# Patient Record
Sex: Female | Born: 1940 | Race: White | Hispanic: No | Marital: Married | Smoking: Never smoker
Health system: Southern US, Community
[De-identification: ages and names within clinical notes are randomized; demographics above are authoritative.]

## PROBLEM LIST (undated history)

## (undated) DIAGNOSIS — M1990 Unspecified osteoarthritis, unspecified site: Secondary | ICD-10-CM

## (undated) DIAGNOSIS — H409 Unspecified glaucoma: Secondary | ICD-10-CM

## (undated) HISTORY — PX: BACK SURGERY: SHX140

## (undated) HISTORY — PX: EYE SURGERY: SHX253

---

## 1975-09-20 HISTORY — PX: WISDOM TOOTH EXTRACTION: SHX21

## 1999-04-12 ENCOUNTER — Other Ambulatory Visit: Admission: RE | Admit: 1999-04-12 | Discharge: 1999-04-12 | Payer: Self-pay | Admitting: Obstetrics and Gynecology

## 1999-12-15 ENCOUNTER — Other Ambulatory Visit: Admission: RE | Admit: 1999-12-15 | Discharge: 1999-12-15 | Payer: Self-pay | Admitting: Obstetrics and Gynecology

## 1999-12-15 ENCOUNTER — Encounter (INDEPENDENT_AMBULATORY_CARE_PROVIDER_SITE_OTHER): Payer: Self-pay

## 2000-04-18 ENCOUNTER — Other Ambulatory Visit: Admission: RE | Admit: 2000-04-18 | Discharge: 2000-04-18 | Payer: Self-pay | Admitting: Obstetrics and Gynecology

## 2001-04-30 ENCOUNTER — Other Ambulatory Visit: Admission: RE | Admit: 2001-04-30 | Discharge: 2001-04-30 | Payer: Self-pay | Admitting: Obstetrics and Gynecology

## 2004-06-22 ENCOUNTER — Other Ambulatory Visit: Admission: RE | Admit: 2004-06-22 | Discharge: 2004-06-22 | Payer: Self-pay | Admitting: Obstetrics and Gynecology

## 2004-07-26 ENCOUNTER — Encounter (INDEPENDENT_AMBULATORY_CARE_PROVIDER_SITE_OTHER): Payer: Self-pay | Admitting: Specialist

## 2004-07-26 ENCOUNTER — Ambulatory Visit (HOSPITAL_COMMUNITY): Admission: RE | Admit: 2004-07-26 | Discharge: 2004-07-26 | Payer: Self-pay | Admitting: Gastroenterology

## 2005-08-04 ENCOUNTER — Other Ambulatory Visit: Admission: RE | Admit: 2005-08-04 | Discharge: 2005-08-04 | Payer: Self-pay | Admitting: Obstetrics and Gynecology

## 2006-09-29 ENCOUNTER — Other Ambulatory Visit: Admission: RE | Admit: 2006-09-29 | Discharge: 2006-09-29 | Payer: Self-pay | Admitting: Obstetrics & Gynecology

## 2008-01-18 ENCOUNTER — Other Ambulatory Visit: Admission: RE | Admit: 2008-01-18 | Discharge: 2008-01-18 | Payer: Self-pay | Admitting: Obstetrics and Gynecology

## 2009-01-29 ENCOUNTER — Other Ambulatory Visit: Admission: RE | Admit: 2009-01-29 | Discharge: 2009-01-29 | Payer: Self-pay | Admitting: Obstetrics and Gynecology

## 2011-02-04 NOTE — Op Note (Signed)
NAMEDEMANI, WEYRAUCH            ACCOUNT NO.:  [id number]   MEDICAL RECORD NO.:  [id number]          PATIENT TYPE:  AMB   LOCATION:  ENDO                         FACILITY:  [facility]   PHYSICIAN:  Graylin Shiver, M.D.   DATE OF BIRTH:  10-Oct-1940   DATE OF PROCEDURE:  07/26/2004  DATE OF DISCHARGE:                                 OPERATIVE REPORT   PROCEDURE:  Colonoscopy with biopsy.   INDICATIONS:  Rectal bleeding.   Informed consent was obtained after explanation of the risks of bleeding,  infection, and perforation.   PREMEDICATIONS:  Fentanyl 75 mcg IV, Versed 6 mg IV.   PROCEDURE:  With the patient in the left lateral decubitus position, a  rectal exam was performed.  No masses were felt.  The Olympus colonoscope  was inserted into the rectum and advanced around the colon to the cecum.  The colon was very tortuous.  The cecum looked normal.  The ileocecal valve  was intubated, and the first few centimeters of the terminal ileum looked  normal.  The ascending colon looked normal.  In the proximal transverse  colon, there were two small 2-3 mm polyps biopsied off with cold forceps.  The rest of the transverse colon looked normal.  The ascending colon,  rectum, and sigmoid looked normal.  There were some internal hemorrhoids.  She tolerated the procedure well without complications.   IMPRESSION:  1.  Two colon polyps at the proximal transverse colon.  2.  Internal hemorrhoids.   PLAN:  Pathology will be checked.   IMPRESSION:  Normal colonoscopy to the cecum.   PLAN:  I would recommend a follow-up screening colonoscopy again in 10  years.      SFG/MEDQ  D:  07/26/2004  T:  07/26/2004  Job:  [id number]   cc:   Pam Drown, M.D.  [address]  [place]  [place] [address]  Fax: [phone]

## 2013-05-15 ENCOUNTER — Other Ambulatory Visit (HOSPITAL_COMMUNITY)
Admission: RE | Admit: 2013-05-15 | Discharge: 2013-05-15 | Disposition: A | Discharge status: Home / Self Care | Payer: PRIVATE HEALTH INSURANCE | Source: Ambulatory Visit | Attending: Family Medicine | Admitting: Family Medicine

## 2013-05-15 ENCOUNTER — Other Ambulatory Visit: Payer: Self-pay | Admitting: Family Medicine

## 2013-05-15 DIAGNOSIS — Z1151 Encounter for screening for human papillomavirus (HPV): Secondary | ICD-10-CM | POA: Insufficient documentation

## 2013-05-15 DIAGNOSIS — Z0000 Encounter for general adult medical examination without abnormal findings: Secondary | ICD-10-CM | POA: Insufficient documentation

## 2016-08-17 ENCOUNTER — Other Ambulatory Visit: Payer: Self-pay | Admitting: Family Medicine

## 2016-08-17 ENCOUNTER — Ambulatory Visit
Admission: RE | Admit: 2016-08-17 | Discharge: 2016-08-17 | Disposition: A | Discharge status: Home / Self Care | Payer: Medicare Other | Source: Ambulatory Visit | Attending: Family Medicine | Admitting: Family Medicine

## 2016-08-17 DIAGNOSIS — G8929 Other chronic pain: Secondary | ICD-10-CM

## 2016-08-17 DIAGNOSIS — M545 Low back pain: Principal | ICD-10-CM

## 2017-05-17 IMAGING — CR DG LUMBAR SPINE 2-3V
3 series · 3 of 3 positions shown · non-contrast
Comparison: None.

CLINICAL DATA: Low back pain, worse on the left side and radiates
down both legs at times.

EXAM:
LUMBAR SPINE - 2-3 VIEW

[t l-spine a.p.]
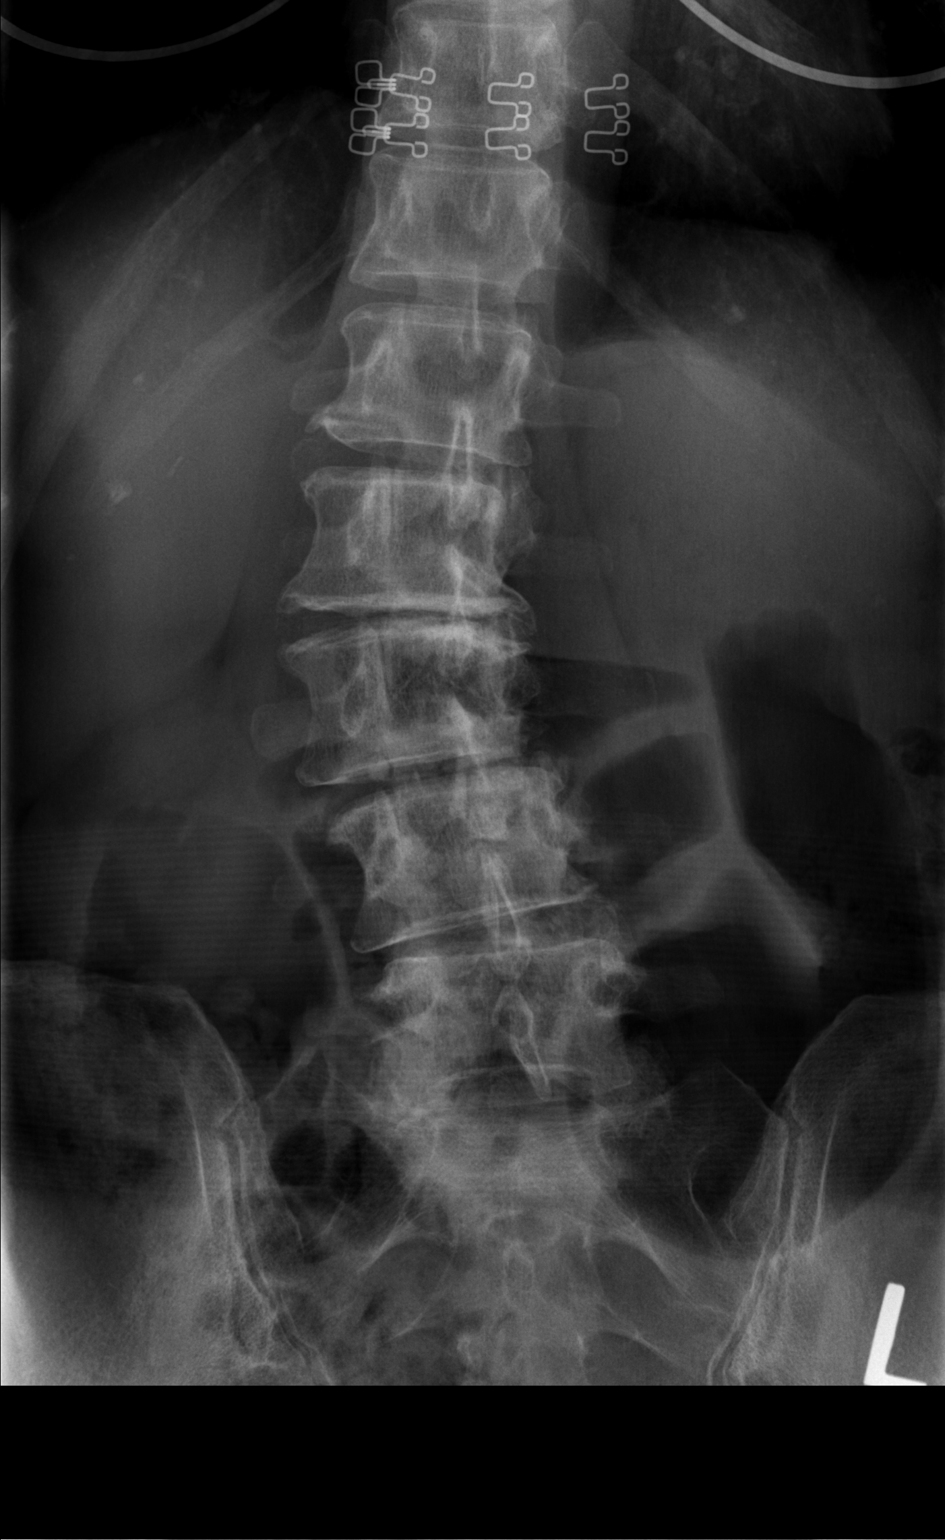

[t l-spine lat]
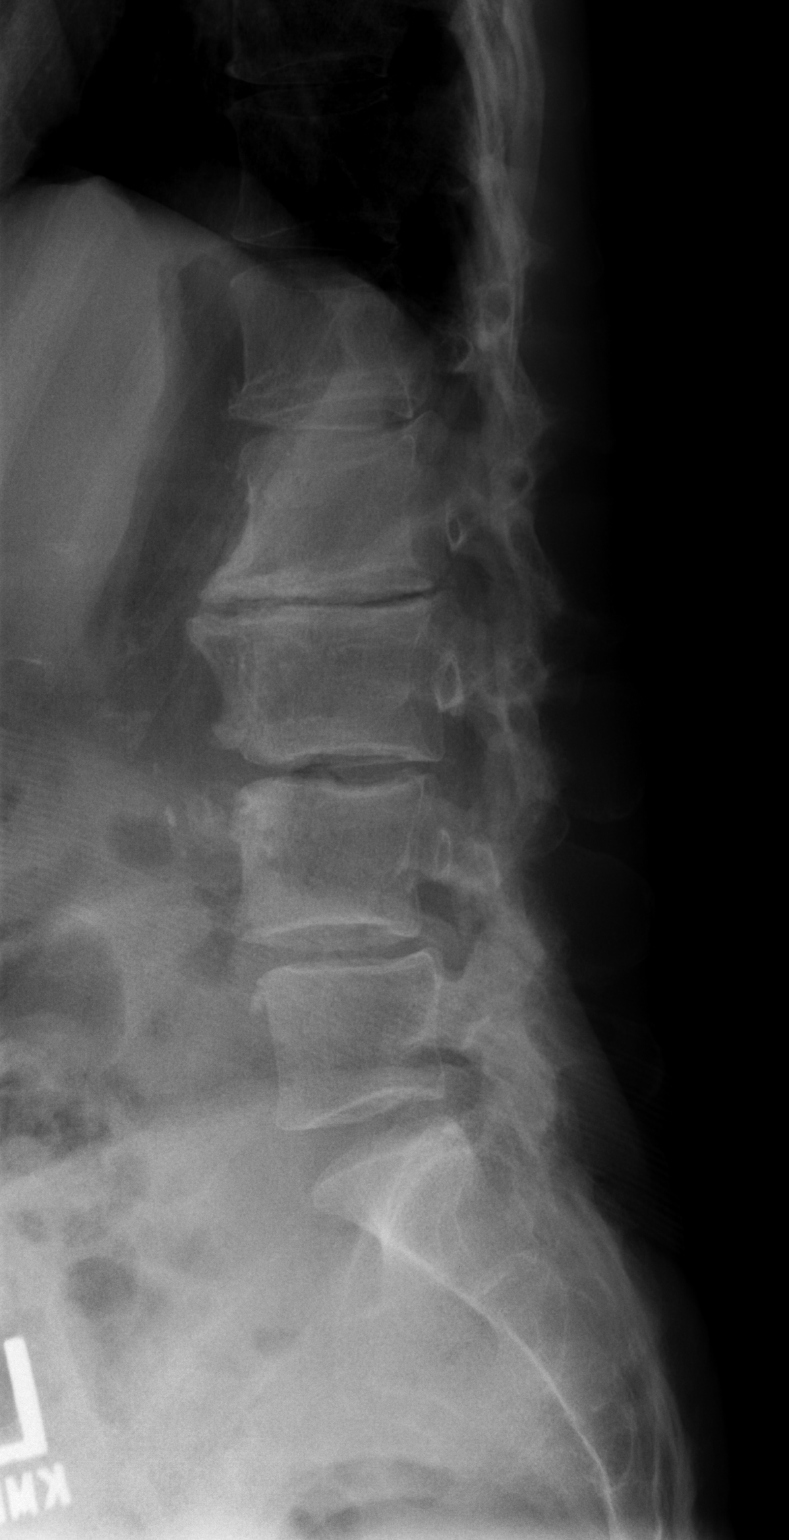

[t l-spine l5-s1 spot]
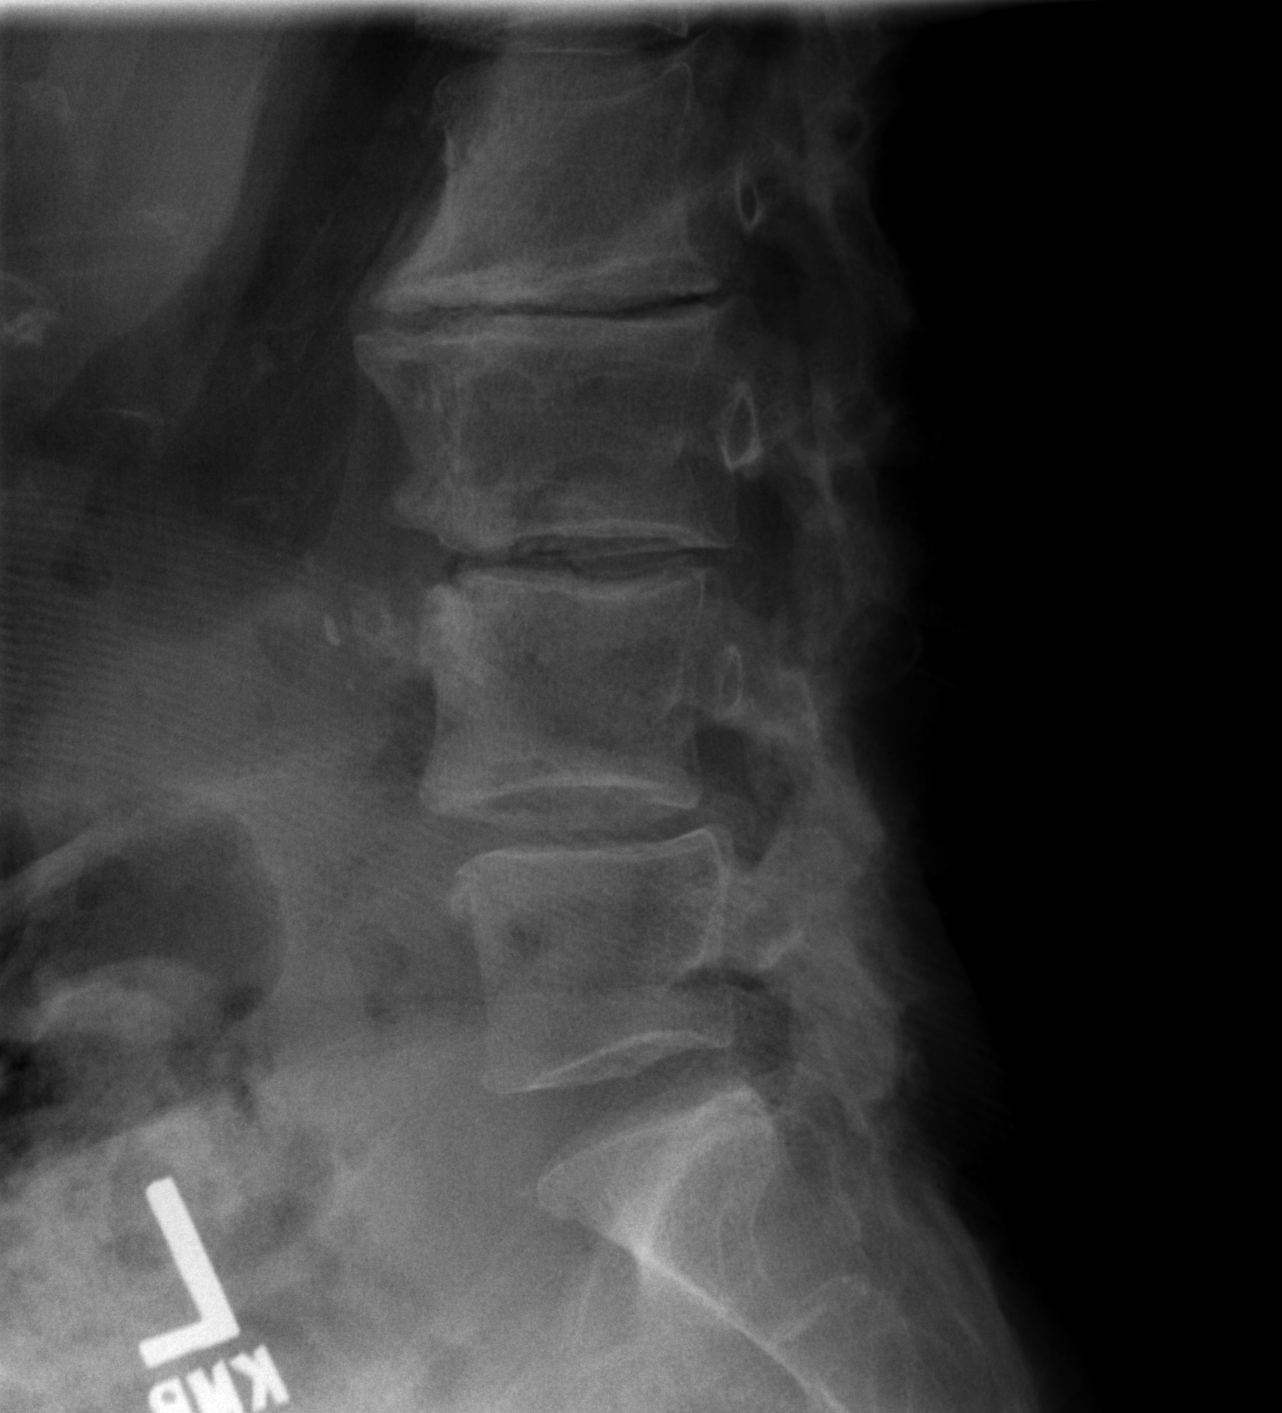

[3 of 3 positions shown; findings below may reference images not displayed]

FINDINGS: Dextroscoliosis of the lumbar spine with the apex at L2-L3. There is
marked disc space narrowing along the left side of L2-L3. Narrowing
along the lateral aspect of the right disc space at L4-L5. Mild disc
space narrowing and endplate changes at L3-L4. Vertebral body
heights are maintained. Minimal anterolisthesis at L4-L5 and minimal
retrolisthesis at L3-L4. No evidence for a fracture.
IMPRESSION: Scoliosis with disc disease, particularly at L2-L3.

## 2019-10-08 ENCOUNTER — Ambulatory Visit: Payer: Medicare Other | Attending: Internal Medicine

## 2019-10-08 DIAGNOSIS — Z23 Encounter for immunization: Secondary | ICD-10-CM

## 2019-10-08 NOTE — Progress Notes (Signed)
   Covid-19 Vaccination Clinic  Name:  Taylor Barker    MRN: [id number] DOB: 1941-04-04  10/08/2019  [name] was observed post Covid-19 immunization for 15 minutes without incidence. She was provided with Vaccine Information Sheet and instruction to access the V-Safe system.   [name] was instructed to call 911 with any severe reactions post vaccine: Marland Kitchen Difficulty breathing  . Swelling of your face and throat  . A fast heartbeat  . A bad rash all over your body  . Dizziness and weakness    Immunizations Administered    Name Date Dose VIS Date Route   Pfizer COVID-19 Vaccine 10/08/2019 11:32 AM 0.3 mL 08/30/2019 Intramuscular   Manufacturer: [employer], [employer]   Lot: [id number]   NDC: [address]-2

## 2019-10-28 ENCOUNTER — Ambulatory Visit: Payer: Medicare Other | Attending: Internal Medicine

## 2019-10-28 DIAGNOSIS — Z23 Encounter for immunization: Secondary | ICD-10-CM | POA: Insufficient documentation

## 2019-10-28 NOTE — Progress Notes (Signed)
   Covid-19 Vaccination Clinic  Name:  Taylor Barker    MRN: [id number] DOB: 07/10/1941  10/28/2019  [name] was observed post Covid-19 immunization for 15 minutes without incidence. She was provided with Vaccine Information Sheet and instruction to access the V-Safe system.   [name] was instructed to call 911 with any severe reactions post vaccine: Marland Kitchen Difficulty breathing  . Swelling of your face and throat  . A fast heartbeat  . A bad rash all over your body  . Dizziness and weakness    Immunizations Administered    Name Date Dose VIS Date Route   Pfizer COVID-19 Vaccine 10/28/2019  9:45 AM 0.3 mL 08/30/2019 Intramuscular   Manufacturer: [employer], [employer]   Lot: [id number]   NDC: [address]-7

## 2020-12-27 ENCOUNTER — Encounter (HOSPITAL_BASED_OUTPATIENT_CLINIC_OR_DEPARTMENT_OTHER): Payer: Self-pay

## 2020-12-27 ENCOUNTER — Emergency Department (HOSPITAL_BASED_OUTPATIENT_CLINIC_OR_DEPARTMENT_OTHER): Payer: Medicare Other

## 2020-12-27 ENCOUNTER — Emergency Department (HOSPITAL_BASED_OUTPATIENT_CLINIC_OR_DEPARTMENT_OTHER)
Admission: EM | Admit: 2020-12-27 | Discharge: 2020-12-27 | Disposition: A | Discharge status: Home / Self Care | Payer: Medicare Other | Attending: Emergency Medicine | Admitting: Emergency Medicine

## 2020-12-27 ENCOUNTER — Other Ambulatory Visit: Payer: Self-pay

## 2020-12-27 DIAGNOSIS — S60222A Contusion of left hand, initial encounter: Secondary | ICD-10-CM

## 2020-12-27 DIAGNOSIS — M7989 Other specified soft tissue disorders: Secondary | ICD-10-CM | POA: Diagnosis not present

## 2020-12-27 DIAGNOSIS — M25532 Pain in left wrist: Secondary | ICD-10-CM | POA: Diagnosis not present

## 2020-12-27 DIAGNOSIS — W1839XA Other fall on same level, initial encounter: Secondary | ICD-10-CM | POA: Insufficient documentation

## 2020-12-27 DIAGNOSIS — S6992XA Unspecified injury of left wrist, hand and finger(s), initial encounter: Secondary | ICD-10-CM | POA: Diagnosis present

## 2020-12-27 HISTORY — DX: Unspecified glaucoma: H40.9

## 2020-12-27 NOTE — ED Triage Notes (Signed)
Pt to ED from home with c/o fall from seated position getting out of car. Pt denies hitting her head or LOC. Pt primary concern is her ring finger is swollen and she is unable to remove her wedding ring at this time.

## 2020-12-27 NOTE — ED Provider Notes (Signed)
[facility] EMERGENCY DEPT Provider Note   CSN: [id number] Arrival date & time: 12/27/20  2054     History Chief Complaint  Patient presents with  . Fall    Taylor Barker is a 80 y.o. female.  HPI   Patient presented to the ED for evaluation of hand pain.  Patient states she fell while she was getting out of her car.  She ended up falling onto her left hand.  Patient started having some swelling on the left ring finger.  She was unable to get her rings off.  She also had some mild pain in her left wrist.  She did not hit her head.  She does not have any numbness or weakness.  No other injuries  Past Medical History:  Diagnosis Date  . Glaucoma     There are no problems to display for this patient.   Past Surgical History:  Procedure Laterality Date  . BACK SURGERY       OB History   No obstetric history on file.     History reviewed. No pertinent family history.  Social History   Tobacco Use  . Smoking status: Never Smoker  . Smokeless tobacco: Never Used  Substance Use Topics  . Alcohol use: Not Currently  . Drug use: Not Currently    Home Medications Prior to Admission medications   Not on File    Allergies    Bee pollen  Review of Systems   Review of Systems  All other systems reviewed and are negative.   Physical Exam Updated Vital Signs BP (!) 172/81 (BP Location: Right Arm)   Pulse 86   Temp 98.3 F (36.8 C) (Oral)   Ht 1.575 m (5\' 2" )   Wt 44.5 kg   SpO2 100%   BMI 17.92 kg/m   Physical Exam Vitals and nursing note reviewed.  Constitutional:      General: She is not in acute distress.    Appearance: She is well-developed.  HENT:     Head: Normocephalic and atraumatic.     Right Ear: External ear normal.     Left Ear: External ear normal.  Eyes:     General: No scleral icterus.       Right eye: No discharge.        Left eye: No discharge.     Conjunctiva/sclera: Conjunctivae normal.  Neck:     Trachea:  No tracheal deviation.  Cardiovascular:     Rate and Rhythm: Normal rate.  Pulmonary:     Effort: Pulmonary effort is normal. No respiratory distress.     Breath sounds: No stridor.  Abdominal:     General: There is no distension.  Musculoskeletal:        General: Tenderness present. No swelling or deformity.     Cervical back: Neck supple.     Comments: Mild tenderness palpation left ring finger, swelling noted around the ring, mild tenderness palpation left wrist  Skin:    General: Skin is warm and dry.     Findings: No rash.  Neurological:     Mental Status: She is alert.     Cranial Nerves: Cranial nerve deficit: no gross deficits.     ED Results / Procedures / Treatments   Labs (all labs ordered are listed, but only abnormal results are displayed) Labs Reviewed - No data to display  EKG None  Radiology DG Wrist Complete Left  Result Date: 12/27/2020 CLINICAL DATA:  80 year female with  fall and left wrist pain. EXAM: LEFT HAND - COMPLETE 3+ VIEW; LEFT WRIST - COMPLETE 3+ VIEW COMPARISON:  None. FINDINGS: Faint curvilinear densities along the posterior aspect of the wrist likely chronic and related to degenerative changes. Acute cortical fractures are less likely. No definite acute fracture or dislocation. The bones are osteopenic. There is arthritic changes of the base of the thumb. The soft tissues are unremarkable. IMPRESSION: 1. No definite acute fracture or dislocation. 2. 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 Electronically Signed   By: Elgie Collard M.D.   On: 12/27/2020 21:57   DG Hand Complete Left  Result Date: 12/27/2020 CLINICAL DATA:  80 year female with fall and left wrist pain. EXAM: LEFT HAND - COMPLETE 3+ VIEW; LEFT WRIST - COMPLETE 3+ VIEW COMPARISON:  None. FINDINGS: Faint curvilinear densities along the posterior aspect of the wrist likely chronic and related to degenerative changes. Acute cortical fractures are  less likely. No definite acute fracture or dislocation. The bones are osteopenic. There is arthritic changes of the base of the thumb. The soft tissues are unremarkable. IMPRESSION: 1. No definite acute fracture or dislocation. 2. 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 Electronically Signed   By: Elgie Collard M.D.   On: 12/27/2020 21:57    Procedures Procedures   Medications Ordered in ED Medications - No data to display  ED Course  I have reviewed the triage vital signs and the nursing notes.  Pertinent labs & imaging results that were available during my care of the patient were reviewed by me and considered in my medical decision making (see chart for details).    MDM Rules/Calculators/A&P                          X-rays without signs of fracture.  Patient's rings had to be removed by the ED staff.  Stable for discharge.  Recommend ice, over-the-counter pain medications as needed Final Clinical Impression(s) / ED Diagnoses Final diagnoses:  Contusion of left hand, initial encounter    Rx / DC Orders ED Discharge Orders    None       Linwood Dibbles, MD 12/27/20 2213

## 2020-12-27 NOTE — Discharge Instructions (Addendum)
Take over-the-counter medications as needed for pain.  Apply ice to help with the swelling.

## 2021-06-25 ENCOUNTER — Other Ambulatory Visit: Payer: Self-pay | Admitting: Neurological Surgery

## 2021-08-11 NOTE — Pre-Procedure Instructions (Signed)
Surgical Instructions    Your procedure is scheduled on Wednesday, August 18, 2021 at 7:30 AM.  Report to [facility] Main Entrance "A" at 5:30 A.M., then check in with the Admitting office.  Call this number if you have problems the morning of surgery:  [phone]   If you have any questions prior to your surgery date call [phone]: Open Monday-Friday 8am-4pm    Remember:  Do not eat or drink after midnight the night before your surgery    Take these medicines the morning of surgery with A SIP OF WATER:  acetaminophen (TYLENOL) - if needed  As of today, STOP taking any Aspirin (unless otherwise instructed by your surgeon) Aleve, Naproxen, Ibuprofen, Motrin, Advil, Goody's, BC's, all herbal medications, fish oil, and all vitamins.                     Do NOT Smoke (Tobacco/Vaping) or drink Alcohol 24 hours prior to your procedure.  If you use a CPAP at night, you may bring all equipment for your overnight stay.   Contacts, glasses, piercing's, hearing aid's, dentures or partials may not be worn into surgery, please bring cases for these belongings.    For patients admitted to the hospital, discharge time will be determined by your treatment team.   Patients discharged the day of surgery will not be allowed to drive home, and someone needs to stay with them for 24 hours.  NO VISITORS WILL BE ALLOWED IN PRE-OP WHERE PATIENTS GET READY FOR SURGERY.  ONLY 1 SUPPORT PERSON MAY BE PRESENT IN THE WAITING ROOM WHILE YOU ARE IN SURGERY.  IF YOU ARE TO BE ADMITTED, ONCE YOU ARE IN YOUR ROOM YOU WILL BE ALLOWED TWO (2) VISITORS.  Minor children may have two parents present. Special consideration for safety and communication needs will be reviewed on a case by case basis.   Special instructions:   [place]- Preparing For Surgery  Before surgery, you can play an important role. Because skin is not sterile, your skin needs to be as free of germs as possible. You can reduce the number  of germs on your skin by washing with CHG (chlorahexidine gluconate) Soap before surgery.  CHG is an antiseptic cleaner which kills germs and bonds with the skin to continue killing germs even after washing.    Oral Hygiene is also important to reduce your risk of infection.  Remember - BRUSH YOUR TEETH THE MORNING OF SURGERY WITH YOUR REGULAR TOOTHPASTE  Please do not use if you have an allergy to CHG or antibacterial soaps. If your skin becomes reddened/irritated stop using the CHG.  Do not shave (including legs and underarms) for at least 48 hours prior to first CHG shower. It is OK to shave your face.  Please follow these instructions carefully.   Shower the NIGHT BEFORE SURGERY and the MORNING OF SURGERY  If you chose to wash your hair, wash your hair first as usual with your normal shampoo.  After you shampoo, rinse your hair and body thoroughly to remove the shampoo.  Use CHG Soap as you would any other liquid soap. You can apply CHG directly to the skin and wash gently with a scrungie or a clean washcloth.   Apply the CHG Soap to your body ONLY FROM THE NECK DOWN.  Do not use on open wounds or open sores. Avoid contact with your eyes, ears, mouth and genitals (private parts). Wash Face and genitals (private parts)  with your  normal soap.   Wash thoroughly, paying special attention to the area where your surgery will be performed.  Thoroughly rinse your body with warm water from the neck down.  DO NOT shower/wash with your normal soap after using and rinsing off the CHG Soap.  Pat yourself dry with a CLEAN TOWEL.  Wear CLEAN PAJAMAS to bed the night before surgery  Place CLEAN SHEETS on your bed the night before your surgery  DO NOT SLEEP WITH PETS.   Day of Surgery: Shower with CHG soap. Do not wear jewelry, make up, nail polish, gel polish, artificial nails, or any other type of covering on natural nails including finger and toenails. If patients have artificial nails,  gel coating, etc. that need to be removed by a nail salon please have this removed prior to surgery. Surgery may need to be canceled/delayed if the surgeon/ anesthesia feels like the patient is unable to be adequately monitored. Do not wear lotions, powders, perfumes, or deodorant. Do not shave 48 hours prior to surgery. Do not bring valuables to the hospital. [facility] is not responsible for any belongings or valuables. Wear Clean/Comfortable clothing the morning of surgery Remember to brush your teeth WITH YOUR REGULAR TOOTHPASTE.   Please read over the following fact sheets that you were given.   3 days prior to your procedure or After your COVID test   You are not required to quarantine however you are required to wear a well-fitting mask when you are out and around people not in your household. If your mask becomes wet or soiled, replace with a new one.   Wash your hands often with soap and water for 20 seconds or clean your hands with an alcohol-based hand sanitizer that contains at least 60% alcohol.   Do not share personal items.   Notify your provider:  o if you are in close contact with someone who has COVID  o or if you develop a fever of 100.4 or greater, sneezing, cough, sore throat, shortness of breath or body aches.

## 2021-08-16 ENCOUNTER — Encounter (HOSPITAL_COMMUNITY): Payer: Self-pay

## 2021-08-16 ENCOUNTER — Other Ambulatory Visit: Payer: Self-pay

## 2021-08-16 ENCOUNTER — Encounter (HOSPITAL_COMMUNITY)
Admission: RE | Admit: 2021-08-16 | Discharge: 2021-08-16 | Disposition: A | Discharge status: Home / Self Care | Payer: Medicare Other | Source: Ambulatory Visit | Attending: Neurological Surgery | Admitting: Neurological Surgery

## 2021-08-16 VITALS — BP 154/72 | HR 91 | Temp 98.2°F | Resp 19 | Ht 62.0 in | Wt 94.1 lb

## 2021-08-16 DIAGNOSIS — Z01818 Encounter for other preprocedural examination: Secondary | ICD-10-CM

## 2021-08-16 DIAGNOSIS — Z01812 Encounter for preprocedural laboratory examination: Secondary | ICD-10-CM | POA: Diagnosis not present

## 2021-08-16 DIAGNOSIS — Z20822 Contact with and (suspected) exposure to covid-19: Secondary | ICD-10-CM | POA: Diagnosis not present

## 2021-08-16 LAB — SURGICAL PCR SCREEN
MRSA, PCR: NEGATIVE
Staphylococcus aureus: NEGATIVE

## 2021-08-16 LAB — CBC
HCT: 39.6 % (ref 36.0–46.0)
Hemoglobin: 13.2 g/dL (ref 12.0–15.0)
MCH: 32 pg (ref 26.0–34.0)
MCHC: 33.3 g/dL (ref 30.0–36.0)
MCV: 95.9 fL (ref 80.0–100.0)
Platelets: 316 10*3/uL (ref 150–400)
RBC: 4.13 MIL/uL (ref 3.87–5.11)
RDW: 11.7 % (ref 11.5–15.5)
WBC: 6 10*3/uL (ref 4.0–10.5)
nRBC: 0 % (ref 0.0–0.2)

## 2021-08-16 LAB — PROTIME-INR
INR: 1.1 (ref 0.8–1.2)
Prothrombin Time: 13.7 seconds (ref 11.4–15.2)

## 2021-08-16 LAB — SARS CORONAVIRUS 2 (TAT 6-24 HRS): SARS Coronavirus 2: NEGATIVE

## 2021-08-16 NOTE — Progress Notes (Signed)
PCP - Dr. Selena Batten Cardiologist - denies  PPM/ICD - n/a  Chest x-ray - n/a EKG - n/a Stress Test - denies  ECHO - denies Cardiac Cath - denies  Sleep Study - denies CPAP - denies  NPO at MD.  Blood Thinner Instructions: n/a Aspirin Instructions: n/a  COVID TEST- 08/16/21, done in PAT   Anesthesia review: No  Patient denies shortness of breath, fever, cough and chest pain at PAT appointment   All instructions explained to the patient, with a verbal understanding of the material. Patient agrees to go over the instructions while at home for a better understanding. Patient also instructed to self quarantine after being tested for COVID-19. The opportunity to ask questions was provided.

## 2021-08-17 NOTE — Anesthesia Preprocedure Evaluation (Addendum)
Anesthesia Evaluation  Patient identified by MRN, date of birth, ID band Patient awake    Reviewed: Allergy & Precautions, NPO status , Patient's Chart, lab work & pertinent test results  Airway Mallampati: II  TM Distance: >3 FB Neck ROM: Full    Dental no notable dental hx. (+) Dental Advisory Given, Teeth Intact   Pulmonary neg pulmonary ROS,    Pulmonary exam normal breath sounds clear to auscultation       Cardiovascular negative cardio ROS Normal cardiovascular exam Rhythm:Regular Rate:Normal     Neuro/Psych negative neurological ROS  negative psych ROS   GI/Hepatic negative GI ROS, Neg liver ROS,   Endo/Other  negative endocrine ROS  Renal/GU negative Renal ROS     Musculoskeletal negative musculoskeletal ROS (+)   Abdominal   Peds  Hematology negative hematology ROS (+)   Anesthesia Other Findings   Reproductive/Obstetrics                            Anesthesia Physical Anesthesia Plan  ASA: 2  Anesthesia Plan: General   Post-op Pain Management: Tylenol PO (pre-op)   Induction: Intravenous  PONV Risk Score and Plan: 4 or greater and Ondansetron, Dexamethasone, Treatment may vary due to age or medical condition and Diphenhydramine  Airway Management Planned: Oral ETT  Additional Equipment:   Intra-op Plan:   Post-operative Plan: Extubation in OR  Informed Consent: I have reviewed the patients History and Physical, chart, labs and discussed the procedure including the risks, benefits and alternatives for the proposed anesthesia with the patient or authorized representative who has indicated his/her understanding and acceptance.     Dental advisory given  Plan Discussed with: CRNA  Anesthesia Plan Comments:        Anesthesia Quick Evaluation

## 2021-08-18 ENCOUNTER — Ambulatory Visit (HOSPITAL_COMMUNITY): Payer: Medicare Other | Admitting: Anesthesiology

## 2021-08-18 ENCOUNTER — Encounter (HOSPITAL_COMMUNITY): Payer: Self-pay | Admitting: Neurological Surgery

## 2021-08-18 ENCOUNTER — Encounter (HOSPITAL_COMMUNITY)
Admission: RE | Disposition: A | Discharge status: Home / Self Care | Payer: Self-pay | Source: Home / Self Care | Attending: Neurological Surgery

## 2021-08-18 ENCOUNTER — Ambulatory Visit (HOSPITAL_COMMUNITY): Payer: Medicare Other

## 2021-08-18 ENCOUNTER — Other Ambulatory Visit: Payer: Self-pay

## 2021-08-18 ENCOUNTER — Ambulatory Visit (HOSPITAL_COMMUNITY)
Admission: RE | Admit: 2021-08-18 | Discharge: 2021-08-18 | Disposition: A | Discharge status: Home / Self Care | Payer: Medicare Other | Attending: Neurological Surgery | Admitting: Neurological Surgery

## 2021-08-18 DIAGNOSIS — M5416 Radiculopathy, lumbar region: Secondary | ICD-10-CM | POA: Diagnosis not present

## 2021-08-18 DIAGNOSIS — M48061 Spinal stenosis, lumbar region without neurogenic claudication: Secondary | ICD-10-CM | POA: Diagnosis not present

## 2021-08-18 DIAGNOSIS — Z419 Encounter for procedure for purposes other than remedying health state, unspecified: Secondary | ICD-10-CM

## 2021-08-18 HISTORY — PX: LUMBAR LAMINECTOMY/DECOMPRESSION MICRODISCECTOMY: SHX5026

## 2021-08-18 SURGERY — LUMBAR LAMINECTOMY/DECOMPRESSION MICRODISCECTOMY 2 LEVELS
Anesthesia: General | Site: Back | Laterality: Right

## 2021-08-18 MED ORDER — DEXAMETHASONE SODIUM PHOSPHATE 10 MG/ML IJ SOLN
INTRAMUSCULAR | Status: DC | PRN
  Administered 2021-08-18: 10 mg via INTRAVENOUS

## 2021-08-18 MED ORDER — BUPIVACAINE HCL (PF) 0.25 % IJ SOLN
INTRAMUSCULAR | Status: AC
  Filled 2021-08-18: qty 30

## 2021-08-18 MED ORDER — PROPOFOL 10 MG/ML IV BOLUS
INTRAVENOUS | Status: AC
  Filled 2021-08-18: qty 20

## 2021-08-18 MED ORDER — SUGAMMADEX SODIUM 200 MG/2ML IV SOLN
INTRAVENOUS | Status: DC | PRN
  Administered 2021-08-18: 200 mg via INTRAVENOUS

## 2021-08-18 MED ORDER — CEFAZOLIN SODIUM-DEXTROSE 2-4 GM/100ML-% IV SOLN
INTRAVENOUS | Status: AC
  Filled 2021-08-18: qty 100

## 2021-08-18 MED ORDER — FENTANYL CITRATE (PF) 250 MCG/5ML IJ SOLN
INTRAMUSCULAR | Status: AC
  Filled 2021-08-18: qty 5

## 2021-08-18 MED ORDER — PHENYLEPHRINE HCL (PRESSORS) 10 MG/ML IV SOLN
INTRAVENOUS | Status: DC | PRN
  Administered 2021-08-18: 80 ug via INTRAVENOUS
  Administered 2021-08-18: 40 ug via INTRAVENOUS
  Administered 2021-08-18: 80 ug via INTRAVENOUS

## 2021-08-18 MED ORDER — CEFAZOLIN SODIUM-DEXTROSE 2-4 GM/100ML-% IV SOLN: 2.0000 g | INTRAVENOUS | Status: DC

## 2021-08-18 MED ORDER — THROMBIN 5000 UNITS EX SOLR
OROMUCOSAL | Status: DC | PRN
  Administered 2021-08-18: 5 mL via TOPICAL

## 2021-08-18 MED ORDER — ACETAMINOPHEN 500 MG PO TABS: 1000.0000 mg | ORAL_TABLET | Freq: Once | ORAL | Status: DC

## 2021-08-18 MED ORDER — FENTANYL CITRATE (PF) 250 MCG/5ML IJ SOLN
INTRAMUSCULAR | Status: DC | PRN
  Administered 2021-08-18: 50 ug via INTRAVENOUS

## 2021-08-18 MED ORDER — PROPOFOL 10 MG/ML IV BOLUS
INTRAVENOUS | Status: DC | PRN
  Administered 2021-08-18: 80 mg via INTRAVENOUS

## 2021-08-18 MED ORDER — ORAL CARE MOUTH RINSE: 15.0000 mL | Freq: Once | OROMUCOSAL | Status: AC

## 2021-08-18 MED ORDER — LACTATED RINGERS IV SOLN: INTRAVENOUS | Status: DC

## 2021-08-18 MED ORDER — LIDOCAINE HCL (CARDIAC) PF 100 MG/5ML IV SOSY
PREFILLED_SYRINGE | INTRAVENOUS | Status: DC | PRN
  Administered 2021-08-18: 40 mg via INTRATRACHEAL

## 2021-08-18 MED ORDER — ROCURONIUM 10MG/ML (10ML) SYRINGE FOR MEDFUSION PUMP - OPTIME
INTRAVENOUS | Status: DC | PRN
  Administered 2021-08-18: 40 mg via INTRAVENOUS

## 2021-08-18 MED ORDER — PHENYLEPHRINE 40 MCG/ML (10ML) SYRINGE FOR IV PUSH (FOR BLOOD PRESSURE SUPPORT)
PREFILLED_SYRINGE | INTRAVENOUS | Status: AC
  Filled 2021-08-18: qty 10

## 2021-08-18 MED ORDER — LIDOCAINE 2% (20 MG/ML) 5 ML SYRINGE
INTRAMUSCULAR | Status: AC
  Filled 2021-08-18: qty 5

## 2021-08-18 MED ORDER — CHLORHEXIDINE GLUCONATE 0.12 % MT SOLN
15.0000 mL | Freq: Once | OROMUCOSAL | Status: AC
  Administered 2021-08-18: 15 mL via OROMUCOSAL

## 2021-08-18 MED ORDER — MEPERIDINE HCL 25 MG/ML IJ SOLN: 6.2500 mg | INTRAMUSCULAR | Status: DC | PRN

## 2021-08-18 MED ORDER — GABAPENTIN 300 MG PO CAPS
ORAL_CAPSULE | ORAL | Status: AC
  Administered 2021-08-18: 300 mg via ORAL
  Filled 2021-08-18: qty 1

## 2021-08-18 MED ORDER — HYDROCODONE-ACETAMINOPHEN 5-325 MG PO TABS: 1.0000 | ORAL_TABLET | ORAL | 0 refills | Status: DC | PRN

## 2021-08-18 MED ORDER — DEXAMETHASONE SODIUM PHOSPHATE 10 MG/ML IJ SOLN: 10.0000 mg | Freq: Once | INTRAMUSCULAR | Status: DC

## 2021-08-18 MED ORDER — 0.9 % SODIUM CHLORIDE (POUR BTL) OPTIME
TOPICAL | Status: DC | PRN
  Administered 2021-08-18: 1000 mL

## 2021-08-18 MED ORDER — DEXAMETHASONE SODIUM PHOSPHATE 10 MG/ML IJ SOLN
INTRAMUSCULAR | Status: AC
  Filled 2021-08-18: qty 1

## 2021-08-18 MED ORDER — ACETAMINOPHEN 500 MG PO TABS: 1000.0000 mg | ORAL_TABLET | ORAL | Status: DC

## 2021-08-18 MED ORDER — THROMBIN 5000 UNITS EX SOLR
CUTANEOUS | Status: AC
  Filled 2021-08-18: qty 5000

## 2021-08-18 MED ORDER — ACETAMINOPHEN 500 MG PO TABS
ORAL_TABLET | ORAL | Status: AC
  Filled 2021-08-18: qty 2

## 2021-08-18 MED ORDER — LACTATED RINGERS IV SOLN: INTRAVENOUS | Status: DC | PRN

## 2021-08-18 MED ORDER — ONDANSETRON HCL 4 MG/2ML IJ SOLN
INTRAMUSCULAR | Status: AC
  Filled 2021-08-18: qty 2

## 2021-08-18 MED ORDER — CHLORHEXIDINE GLUCONATE CLOTH 2 % EX PADS: 6.0000 | MEDICATED_PAD | Freq: Once | CUTANEOUS | Status: DC

## 2021-08-18 MED ORDER — GABAPENTIN 300 MG PO CAPS: 300.0000 mg | ORAL_CAPSULE | ORAL | Status: AC

## 2021-08-18 MED ORDER — BUPIVACAINE HCL (PF) 0.25 % IJ SOLN
INTRAMUSCULAR | Status: DC | PRN
  Administered 2021-08-18: 5 mL

## 2021-08-18 MED ORDER — ONDANSETRON HCL 4 MG/2ML IJ SOLN
INTRAMUSCULAR | Status: DC | PRN
  Administered 2021-08-18: 4 mg via INTRAVENOUS

## 2021-08-18 MED ORDER — ROCURONIUM BROMIDE 10 MG/ML (PF) SYRINGE
PREFILLED_SYRINGE | INTRAVENOUS | Status: AC
  Filled 2021-08-18: qty 10

## 2021-08-18 MED ORDER — PHENYLEPHRINE HCL-NACL 20-0.9 MG/250ML-% IV SOLN
INTRAVENOUS | Status: DC | PRN
  Administered 2021-08-18: 50 ug/min via INTRAVENOUS

## 2021-08-18 MED ORDER — DROPERIDOL 2.5 MG/ML IJ SOLN: 0.6250 mg | Freq: Once | INTRAMUSCULAR | Status: DC | PRN

## 2021-08-18 SURGICAL SUPPLY — 46 items
ADH SKN CLS APL DERMABOND .7 (GAUZE/BANDAGES/DRESSINGS) ×1
APL SKNCLS STERI-STRIP NONHPOA (GAUZE/BANDAGES/DRESSINGS) ×1
BAG COUNTER SPONGE SURGICOUNT (BAG) ×2 IMPLANT
BAG SPNG CNTER NS LX DISP (BAG) ×1
BAND INSRT 18 STRL LF DISP RB (MISCELLANEOUS) ×2
BAND RUBBER #18 3X1/16 STRL (MISCELLANEOUS) ×4 IMPLANT
BENZOIN TINCTURE PRP APPL 2/3 (GAUZE/BANDAGES/DRESSINGS) ×2 IMPLANT
BUR CARBIDE MATCH 3.0 (BURR) ×2 IMPLANT
CANISTER SUCT 3000ML PPV (MISCELLANEOUS) ×2 IMPLANT
CLSR STERI-STRIP ANTIMIC 1/2X4 (GAUZE/BANDAGES/DRESSINGS) ×1 IMPLANT
DERMABOND ADVANCED (GAUZE/BANDAGES/DRESSINGS) ×1
DERMABOND ADVANCED .7 DNX12 (GAUZE/BANDAGES/DRESSINGS) IMPLANT
DRAPE LAPAROTOMY 100X72X124 (DRAPES) ×2 IMPLANT
DRAPE MICROSCOPE LEICA (MISCELLANEOUS) ×2 IMPLANT
DRAPE SURG 17X23 STRL (DRAPES) ×2 IMPLANT
DRSG OPSITE POSTOP 4X6 (GAUZE/BANDAGES/DRESSINGS) ×1 IMPLANT
DURAPREP 26ML APPLICATOR (WOUND CARE) ×2 IMPLANT
ELECT REM PT RETURN 9FT ADLT (ELECTROSURGICAL) ×2
ELECTRODE REM PT RTRN 9FT ADLT (ELECTROSURGICAL) ×1 IMPLANT
GAUZE 4X4 16PLY ~~LOC~~+RFID DBL (SPONGE) IMPLANT
GLOVE SURG ENC MOIS LTX SZ7 (GLOVE) IMPLANT
GLOVE SURG ENC MOIS LTX SZ8 (GLOVE) ×2 IMPLANT
GLOVE SURG UNDER POLY LF SZ7 (GLOVE) IMPLANT
GOWN STRL REUS W/ TWL LRG LVL3 (GOWN DISPOSABLE) IMPLANT
GOWN STRL REUS W/ TWL XL LVL3 (GOWN DISPOSABLE) ×1 IMPLANT
GOWN STRL REUS W/TWL 2XL LVL3 (GOWN DISPOSABLE) IMPLANT
GOWN STRL REUS W/TWL LRG LVL3 (GOWN DISPOSABLE)
GOWN STRL REUS W/TWL XL LVL3 (GOWN DISPOSABLE) ×2
HEMOSTAT POWDER KIT SURGIFOAM (HEMOSTASIS) ×2 IMPLANT
KIT BASIN OR (CUSTOM PROCEDURE TRAY) ×2 IMPLANT
KIT TURNOVER KIT B (KITS) ×2 IMPLANT
NDL HYPO 25X1 1.5 SAFETY (NEEDLE) ×1 IMPLANT
NDL SPNL 20GX3.5 QUINCKE YW (NEEDLE) IMPLANT
NEEDLE HYPO 25X1 1.5 SAFETY (NEEDLE) ×2 IMPLANT
NEEDLE SPNL 20GX3.5 QUINCKE YW (NEEDLE) IMPLANT
NS IRRIG 1000ML POUR BTL (IV SOLUTION) ×2 IMPLANT
PACK LAMINECTOMY NEURO (CUSTOM PROCEDURE TRAY) ×2 IMPLANT
PAD ARMBOARD 7.5X6 YLW CONV (MISCELLANEOUS) ×6 IMPLANT
STRIP CLOSURE SKIN 1/2X4 (GAUZE/BANDAGES/DRESSINGS) ×2 IMPLANT
SUT VIC AB 0 CT1 18XCR BRD8 (SUTURE) ×1 IMPLANT
SUT VIC AB 0 CT1 8-18 (SUTURE) ×2
SUT VIC AB 2-0 CP2 18 (SUTURE) ×2 IMPLANT
SUT VIC AB 3-0 SH 8-18 (SUTURE) ×2 IMPLANT
TOWEL GREEN STERILE (TOWEL DISPOSABLE) ×2 IMPLANT
TOWEL GREEN STERILE FF (TOWEL DISPOSABLE) ×2 IMPLANT
WATER STERILE IRR 1000ML POUR (IV SOLUTION) ×2 IMPLANT

## 2021-08-18 NOTE — Transfer of Care (Signed)
Immediate Anesthesia Transfer of Care Note  Patient: Taylor Barker  Procedure(s) Performed: Laminectomy and Foraminotomy - Lumbar three-Lumbar four - Lumbar four-Lumbar five - right (Right: Back)  Patient Location: PACU  Anesthesia Type:General  Level of Consciousness: oriented, drowsy, patient cooperative and responds to stimulation  Airway & Oxygen Therapy: Patient Spontanous Breathing and Patient connected to nasal cannula oxygen  Post-op Assessment: Report given to RN, Post -op Vital signs reviewed and stable and Patient moving all extremities X 4  Post vital signs: Reviewed and stable  Last Vitals:  Vitals Value Taken Time  BP 148/85 08/18/21 0906  Temp 36.7 C 08/18/21 0906  Pulse 75 08/18/21 0910  Resp 16 08/18/21 0910  SpO2 100 % 08/18/21 0910  Vitals shown include unvalidated device data.  Last Pain:  Vitals:   08/18/21 0906  TempSrc:   PainSc: 0-No pain      Patients Stated Pain Goal: 3 (08/18/21 8937)  Complications: No notable events documented.

## 2021-08-18 NOTE — Anesthesia Procedure Notes (Signed)

## 2021-08-18 NOTE — Op Note (Signed)
08/18/2021  9:00 AM  PATIENT:  Taylor Barker  80 y.o. 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 Imaging showed lumbar spinal stenosis. The patient tried conservative measures without relief. Pain was debilitating. Recommended right L3-4 and L4-5 decompressive hemilaminectomy. Patient understood the risks, benefits, and alternatives and potential outcomes and wished to proceed.  PROCEDURE DETAILS: The patient was taken to the operating room and after induction of adequate generalized endotracheal anesthesia, the patient was rolled into the prone position on the Wilson frame and all pressure points were padded. The lumbar region was cleaned and then prepped with DuraPrep and draped in the usual sterile fashion. 5 cc of local anesthesia was injected and then a dorsal midline incision was made and carried down to the lumbo sacral fascia. The fascia was opened and the paraspinous musculature was taken down in a subperiosteal fashion to expose L3-4 and L4-5 on the right. Intraoperative x-ray confirmed my level, and then I used a combination of the high-speed drill and the Kerrison punches to perform a hemilaminectomy, medial facetectomy, and foraminotomy at L3-4 and L4-5 on the right. The underlying yellow ligament was opened and removed in a piecemeal fashion to expose the underlying dura and exiting nerve root. I undercut the lateral recess and dissected down until I was medial to and distal to the pedicle. The nerve  root was well decompressed.  I then palpated with a coronary dilator along the nerve root and into the foramen to assure adequate decompression. I felt no more compression of the nerve root at either level.  At L3-4 I drilled up under the spinous process and performed a sublaminar decompression to decompress the left lateral recess.  I irrigated with saline solution containing bacitracin. Achieved hemostasis with bipolar cautery, lined the dura with thin layer of Surgifoam which I then irrigated away and then closed the fascia with 0 Vicryl. I closed the subcutaneous tissues with 2-0 Vicryl and the subcuticular tissues with 3-0 Vicryl. The skin was then closed with Dermabond, benzoin and Steri-Strips. The drapes were removed, a sterile dressing was applied.  My nurse practitioner was involved in the exposure, safe retraction of the neural elements, the disc work and the closure. the patient was awakened from general anesthesia and transferred to the recovery room in stable condition. At the end of the procedure all sponge, needle and instrument counts were correct.    PLAN OF CARE: Discharge to home after PACU  PATIENT DISPOSITION:  PACU - hemodynamically stable.   Delay start of Pharmacological VTE agent (>24hrs) due to surgical blood loss or risk of bleeding:  yes

## 2021-08-18 NOTE — H&P (Signed)
Subjective: Patient is a 80 y.o. female admitted for LL. Onset of symptoms was a few months ago, gradually worsening since that time.  The pain is rated severe, and is located at the across the lower back and radiates to RLE. The pain is described as aching and occurs intermittently. The symptoms have been progressive. Symptoms are exacerbated by exercise, standing, and walking for more than a few minutes. 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 Authorizing Provider  acetaminophen (TYLENOL) 325 MG tablet Take 650 mg by mouth every 6 (six) hours as needed for moderate pain.   Yes [provider]  Biotin 5000 MCG TABS Take 5,000 mcg by mouth daily.   Yes [provider]  Calcium-Vitamin D-Vitamin K (VIACTIV CALCIUM PLUS D) 650-12.5-40 MG-MCG-MCG CHEW Chew 1 each by mouth in the morning and at bedtime.   Yes [provider]  cholecalciferol (VITAMIN D3) 25 MCG (1000 UNIT) tablet Take 1,000 Units by mouth 2 (two) times a week.   Yes [provider]  latanoprost (XALATAN) 0.005 % ophthalmic solution Place 1 drop into both eyes at bedtime. 06/14/21  Yes [provider]  risedronate (ACTONEL) 150 MG tablet Take 150 mg by mouth every 30 (thirty) days. 07/19/21  Yes [provider]   Allergies  Allergen Reactions   Flonase [Fluticasone]     Nose bleeds    Bee Pollen Cough   Fosamax [Alendronate] Rash    Pt felt lousy     Social History   Tobacco Use   Smoking status: Never   Smokeless tobacco: Never  Substance Use Topics   Alcohol use: Not Currently    History reviewed. No pertinent family history.   Review of Systems  Positive ROS: neg  All other systems have been reviewed and were otherwise negative with the exception of those mentioned  in the HPI and as above.  Objective: Vital signs in last 24 hours: Temp:  [99.4 F (37.4 C)] 99.4 F (37.4 C) (11/30 3545) Pulse Rate:  [85] 85 (11/30 0608) Resp:  [18] 18 (11/30 6256) BP: (153)/(61) 153/61 (11/30 0608) SpO2:  [98 %] 98 % (11/30 3893) Weight:  [42.7 kg] 42.7 kg (11/30 7342)  General Appearance: Alert, cooperative, no distress, appears stated age Head: Normocephalic, without obvious abnormality, atraumatic Eyes: PERRL, conjunctiva/corneas clear, EOM's intact    Neck: Supple, symmetrical, trachea midline Back: Symmetric, no curvature, ROM normal, no CVA tenderness Lungs:  respirations unlabored Heart: Regular rate and rhythm Abdomen: Soft, non-tender Extremities: Extremities normal, atraumatic, no cyanosis or edema Pulses: 2+ and symmetric all extremities Skin: Skin color, texture, turgor normal, no rashes or lesions  NEUROLOGIC:   Mental status: Alert and oriented x4,  no aphasia, good attention span, fund of knowledge, and memory Motor Exam - grossly normal Sensory Exam - grossly normal Reflexes: 1+ Coordination - grossly normal Gait - grossly normal Balance - grossly normal Cranial Nerves: I: smell Not tested  II: visual acuity  OS: nl    OD: nl  II: visual fields Full to confrontation  II: pupils Equal, round, reactive to light  III,VII: ptosis None  III,IV,VI: extraocular muscles  Full ROM  V: mastication Normal  V: facial light touch sensation  Normal  V,VII: corneal reflex  Present  VII: facial muscle function - upper  Normal  VII: facial muscle function - lower Normal  VIII: hearing Not tested  IX: soft palate elevation  Normal  IX,X: gag reflex Present  XI: trapezius strength  5/5  XI: sternocleidomastoid strength 5/5  XI: neck flexion strength  5/5  XII: tongue strength  Normal    Data Review Lab Results  Component Value Date   WBC 6.0 08/16/2021   HGB 13.2 08/16/2021   HCT 39.6 08/16/2021   MCV 95.9 08/16/2021   PLT 316  08/16/2021   No results found for: NA, K, CL, CO2, BUN, CREATININE, GLUCOSE Lab Results  Component Value Date   INR 1.1 08/16/2021    Assessment/Plan:  Estimated body mass index is 17.21 kg/m as calculated from the following:   Height as of this encounter: 5\' 2"  (1.575 m).   Weight as of this encounter: 42.7 kg. 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 Patient has failed a reasonable attempt at conservative therapy.  I explained the condition and procedure to the patient and answered any questions.  Patient wishes to proceed with procedure as planned. Understands risks/ benefits and typical outcomes of procedure.   08/18/2021 7:25 AM

## 2021-08-19 ENCOUNTER — Encounter (HOSPITAL_COMMUNITY): Payer: Self-pay | Admitting: Neurological Surgery

## 2021-08-19 NOTE — Anesthesia Postprocedure Evaluation (Signed)
Anesthesia Post Note  Patient: Taylor Barker  Procedure(s) Performed: Laminectomy and Foraminotomy - Lumbar three-Lumbar four - Lumbar four-Lumbar five - right (Right: Back)     Patient location during evaluation: PACU Anesthesia Type: General Level of consciousness: sedated and patient cooperative Pain management: pain level controlled Vital Signs Assessment: post-procedure vital signs reviewed and stable Respiratory status: spontaneous breathing Cardiovascular status: stable Anesthetic complications: no   No notable events documented.  Last Vitals:  Vitals:   08/18/21 0921 08/18/21 0932  BP: (!) 142/66 (!) 152/69  Pulse: 71 77  Resp: 13 12  Temp:  36.9 C  SpO2: 100% 100%    Last Pain:  Vitals:   08/18/21 0932  TempSrc:   PainSc: 0-No pain                 Lewie Loron

## 2021-11-22 ENCOUNTER — Ambulatory Visit: Payer: Medicare Other | Admitting: Orthopaedic Surgery

## 2021-11-22 ENCOUNTER — Other Ambulatory Visit: Payer: Self-pay

## 2021-11-22 ENCOUNTER — Ambulatory Visit: Payer: Self-pay

## 2021-11-22 DIAGNOSIS — M25551 Pain in right hip: Secondary | ICD-10-CM

## 2021-11-22 NOTE — Progress Notes (Signed)
? ?Office Visit Note ?  ?Patient: Taylor Barker           ?Date of Birth: Sep 07, 1941           ?MRN: [id number] ?Visit Date: 11/22/2021 ?             ?Requested by: Gweneth Dimitri, MD ?[address] ?[place],  [place] [address] ?PCP: Gweneth Dimitri, MD ? ? ?Assessment & Plan: ?Visit Diagnoses:  ?1. Pain in right hip   ? ? 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  I went over hip replacement model and talked about this type of surgery in detail.  I gave her handout about it as well.  We discussed the interoperative and postoperative course and what to expect with surgery.  She would like to try at least a steroid injection in her right hip and I think this is absolutely reasonable considering she is still in recovery from 2 different operations.  We will send her to Dr. Alvester Morin for an intra-articular steroid injection of the right hip joint under fluoroscopy.  I will see her back in about 4 weeks to see how she is doing overall.  All questions and concerns were answered and addressed. ? ?Follow-Up Instructions: Return in about 4 weeks (around 12/20/2021).  ? ?Orders:  ?Orders Placed This Encounter  ?Procedures  ? XR HIP UNILAT W OR W/O PELVIS 1V RIGHT  ? ?No orders of the defined types were placed in this encounter. ? ? ? ? Procedures: ?No procedures performed ? ? ?Clinical Data: ?No additional findings. ? ? ?Subjective: ?Chief Complaint  ?Patient presents with  ? Right Hip - Pain  ?The patient is a petite 81 year old female sent from Dr. Marikay Alar of neurosurgery to evaluate and treat right hip pain.  She is been having right hip and groin pain for some time now.  She originally saw Dr. Yetta Barre and had lumbar spine surgery.  At that point she was having radicular symptoms going down her entire right leg.  She said the back surgery helped significantly with that aspect of her pain but she is still having right hip and groin pain.  She had her surgery by Dr. Marlyne Beards  with her lumbar spinal November 30.  In January she had cataract surgery.  She is not a diabetic and not on blood thinning medication.  She denies any injuries to the hip.  Her husband is with her today. ? ?HPI ? ?Review of Systems ?There is no listed headache, chest pain, shortness of breath, fever, chills, nausea, vomiting ? ?Objective: ?Vital Signs: There were no vitals taken for this visit. ? ?Physical Exam ?She is alert and oriented x3 and in no acute distress ?Ortho Exam ?Examination of her right hip shows significant stiffness and pain in the groin with internal and external rotation.  The left hip exam is normal.  There is no pain to palpation over the right or left hip trochanteric areas. ?Specialty Comments:  ?No specialty comments available. ? ?Imaging: ?XR HIP UNILAT W OR W/O PELVIS 1V RIGHT ? 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  There are para-articular osteophytes and sclerotic changes as well as cystic changes within the right hip.  The left hip on the AP view feels normal.  ? ? ?PMFS History: ?There are no problems to display for this patient. ? ?  Past Medical History:  ?Diagnosis Date  ? Glaucoma   ?  ?No family history on file.  ?Past Surgical History:  ?Procedure Laterality Date  ? BACK SURGERY    ? LUMBAR LAMINECTOMY/DECOMPRESSION MICRODISCECTOMY Right 08/18/2021  ? Procedure: Laminectomy and Foraminotomy - Lumbar three-Lumbar four - Lumbar four-Lumbar five - right;  Surgeon: Tia Alert, MD;  Location: [facility] OR;  Service: Neurosurgery;  Laterality: Right;  3C  ? ?Social History  ? ?Occupational History  ? Not on file  ?Tobacco Use  ? Smoking status: Never  ? Smokeless tobacco: Never  ?Vaping Use  ? Vaping Use: Never used  ?Substance and Sexual Activity  ? Alcohol use: Not Currently  ? Drug use: Not Currently  ? Sexual activity: Not Currently  ? ? ? ? ? ? ?

## 2021-12-09 ENCOUNTER — Ambulatory Visit: Payer: Self-pay

## 2021-12-09 ENCOUNTER — Ambulatory Visit: Payer: Medicare Other | Admitting: Physical Medicine and Rehabilitation

## 2021-12-09 ENCOUNTER — Encounter: Payer: Self-pay | Admitting: Physical Medicine and Rehabilitation

## 2021-12-09 ENCOUNTER — Other Ambulatory Visit: Payer: Self-pay

## 2021-12-09 DIAGNOSIS — M25551 Pain in right hip: Secondary | ICD-10-CM

## 2021-12-09 NOTE — Progress Notes (Signed)
Pt state right hip pain. Pt state any movement makes the pain worse. Pt state she takes over the counter pain meds to help ease her pain. ? ?Numeric Pain Rating Scale and Functional Assessment ?Average Pain 3 ? ? ?In the last MONTH (on 0-10 scale) has pain interfered with the following? ? ?1. General activity like being  able to carry out your everyday physical activities such as walking, climbing stairs, carrying groceries, or moving a chair?  ?Rating(5) ? ? ?-BT, -Dye Allergies. ? ?

## 2021-12-14 MED ORDER — BUPIVACAINE HCL 0.25 % IJ SOLN
4.0000 mL | INTRAMUSCULAR | Status: AC | PRN
  Administered 2021-12-09: 4 mL via INTRA_ARTICULAR

## 2021-12-14 MED ORDER — TRIAMCINOLONE ACETONIDE 40 MG/ML IJ SUSP
60.0000 mg | INTRAMUSCULAR | Status: AC | PRN
  Administered 2021-12-09: 60 mg via INTRA_ARTICULAR

## 2021-12-14 NOTE — Progress Notes (Signed)
? ?  Taylor Barker - 81 y.o. female MRN [id number]  Date of birth: 19-Mar-1941 ? ?Office Visit Note: ?Visit Date: 12/09/2021 ?PCP: Gweneth Dimitri, MD ?Referred by: Gweneth Dimitri, MD ? ?Subjective: ?Chief Complaint  ?Patient presents with  ? Right Hip - Pain  ? ?HPI:  Taylor Barker is a 81 y.o. female who comes in today at the request of Dr. Doneen Poisson for planned Right anesthetic hip arthrogram with fluoroscopic guidance.  The patient has failed conservative care including home exercise, medications, time and activity modification.  This injection will be diagnostic and hopefully therapeutic.  Please see requesting physician notes for further details and justification. ? ?ROS Otherwise per HPI. ? ?Assessment & Plan: ?Visit Diagnoses:  ?  ICD-10-CM   ?1. Pain in right hip  M25.551 XR C-ARM NO REPORT  ?  ?  ?Plan: No additional findings.  ? ?Meds & Orders: No orders of the defined types were placed in this encounter. ?  ?Orders Placed This Encounter  ?Procedures  ? Large Joint Inj  ? XR C-ARM NO REPORT  ?  ?Follow-up: Return for visit to requesting provider as needed.  ? ?Procedures: ?Large Joint Inj: R hip joint on 12/09/2021 9:45 AM ?Indications: diagnostic evaluation and pain ?Details: 22 G 3.5 in needle, fluoroscopy-guided anterior approach ? ?Arthrogram: No ? ?Medications: 4 mL bupivacaine 0.25 %; 60 mg triamcinolone acetonide 40 MG/ML ?Outcome: tolerated well, no immediate complications ? ?There was excellent flow of contrast producing a partial arthrogram of the hip. The patient did have relief of symptoms during the anesthetic phase of the injection. ?Procedure, treatment alternatives, risks and benefits explained, specific risks discussed. Consent was given by the patient. Immediately prior to procedure a time out was called to verify the correct patient, procedure, equipment, support staff and site/side marked as required. Patient was prepped and draped in the usual sterile fashion.  ? ?  ?    ? ?Clinical History: ?No specialty comments available.  ? ? ? ?Objective:  VS:  HT:    WT:   BMI:     BP:   HR: bpm  TEMP: ( )  RESP:  ?Physical Exam  ? ?Imaging: ?No results found. ?

## 2021-12-20 ENCOUNTER — Ambulatory Visit: Payer: Medicare Other | Admitting: Orthopaedic Surgery

## 2021-12-20 ENCOUNTER — Encounter: Payer: Self-pay | Admitting: Orthopaedic Surgery

## 2021-12-20 DIAGNOSIS — M25551 Pain in right hip: Secondary | ICD-10-CM | POA: Diagnosis not present

## 2021-12-20 DIAGNOSIS — M1611 Unilateral primary osteoarthritis, right hip: Secondary | ICD-10-CM

## 2021-12-20 NOTE — Progress Notes (Signed)
The patient is well-known to me.  She is 81 years old and does have arthritis in her right hip.  She had a steroid injection in that right hip joint under fluoroscopy by Dr. Alvester Morin just under 2 weeks ago.  She says that is really helped some.  She is thinking about the possibility of hip replacement surgery in the summer.  Right now as of today though she is doing well. ? ?Her right hip moves smoothly and fluidly with minimal pain on exam. ? ?I think it would be best if we see her in 4 weeks from now to determine whether or not she is ready for hip replacement surgery depending on how long the injection is lasting.  From an x-ray standpoint, she does have significant arthritis with her right hip. ? ?We will see her back in 4 weeks to see how she is doing overall but no x-rays are needed.  If she is experiencing enough pain we will consider setting her up for a hip replacement. ?

## 2022-01-17 ENCOUNTER — Ambulatory Visit: Payer: Medicare Other | Admitting: Orthopaedic Surgery

## 2022-01-17 VITALS — Ht 62.0 in | Wt 94.0 lb

## 2022-01-17 DIAGNOSIS — M1611 Unilateral primary osteoarthritis, right hip: Secondary | ICD-10-CM

## 2022-01-17 DIAGNOSIS — M25551 Pain in right hip: Secondary | ICD-10-CM

## 2022-01-17 NOTE — Progress Notes (Signed)
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  Her pain has become debilitating and it is detrimentally affecting her mobility, her quality of life, and her actives daily living.  She has well-documented severe arthritis of the right hip.  She has tried and failed all forms of conservative treatment including offloading the hip, activity modification, anti-inflammatories and steroid injection in her right hip joint.  She would like to consider surgery sometime later in the summer after a beach trip.  I have talked her about surgery in the past.  I given her handout on hip replacement surgery and gone over her x-rays.  We talked about what to expect from a preoperative, intraoperative and postoperative standpoint.  I discussed the risk and benefits of this surgery in detail.  I was able to review her records and her epic chart and all my previous notes about her.  She is a very active 81 year old female. ? ?Her left hip moves smoothly and fluidly.  Her right hip has a little bit more stiffness but definitely pain with internal and external rotation.  Her x-rays do show severe end-stage arthritis of the right hip with bone-on-bone wear especially the superior lateral aspect and para-articular osteophytes. ? ?Up until surgery she will continue work on activity modification and hip strengthening as well as even trying a cane in her left opposite side of the right hip.  All question concerns were answered and addressed.  We will work on getting this scheduled for later in July. ?

## 2022-02-04 ENCOUNTER — Other Ambulatory Visit: Payer: Self-pay

## 2022-04-07 NOTE — Progress Notes (Signed)
Pt. Need orders for surgery. 

## 2022-04-07 NOTE — Patient Instructions (Addendum)
DUE TO COVID-19 ONLY TWO VISITORS  (aged 79 and older)  ARE ALLOWED TO COME WITH YOU AND STAY IN THE WAITING ROOM ONLY DURING PRE OP AND PROCEDURE.   **NO VISITORS ARE ALLOWED IN THE SHORT STAY AREA OR RECOVERY ROOM!!**  IF YOU WILL BE ADMITTED INTO THE HOSPITAL YOU ARE ALLOWED ONLY FOUR SUPPORT PEOPLE DURING VISITATION HOURS ONLY (7 AM -8PM)   The support person(s) must pass our screening, gel in and out, and wear a mask at all times, including in the patient's room. Patients must also wear a mask when staff or their support person are in the room. Visitors GUEST BADGE MUST BE WORN VISIBLY  One adult visitor may remain with you overnight and MUST be in the room by 8 P.M.     Your procedure is scheduled on: 04/15/22   Report to [facility] Main Entrance    Report to admitting at : 5:15 AM   Call this number if you have problems the morning of surgery [phone]   Do not eat food :After Midnight.   After Midnight you may have the following liquids until : 4:15 AM DAY OF SURGERY  Water Black Coffee (sugar ok, NO MILK/CREAM OR CREAMERS)  Tea (sugar ok, NO MILK/CREAM OR CREAMERS) regular and decaf                             Plain Jell-O (NO RED)                                           Fruit ices (not with fruit pulp, NO RED)                                     Popsicles (NO RED)                                                                  Juice: apple, WHITE grape, WHITE cranberry Sports drinks like Gatorade (NO RED)              Drink  Ensure drink AT: 4:15 AM the day of surgery.     The day of surgery:  Drink ONE (1) Pre-Surgery Clear Ensure or G2 at AM the morning of surgery. Drink in one sitting. Do not sip.  This drink was given to you during your hospital  pre-op appointment visit. Nothing else to drink after completing the  Pre-Surgery Clear Ensure or G2.          If you have questions, please contact your surgeon's office.    Oral Hygiene is also  important to reduce your risk of infection.                                    Remember - BRUSH YOUR TEETH THE MORNING OF SURGERY WITH YOUR REGULAR TOOTHPASTE   Do NOT smoke after Midnight   Take these medicines the morning of surgery with A  SIP OF WATER: allegra.Use eye drops as usual.Tylenol as needed.  DO NOT TAKE ANY ORAL DIABETIC MEDICATIONS DAY OF YOUR SURGERY  Bring CPAP mask and tubing day of surgery.                              You may not have any metal on your body including hair pins, jewelry, and body piercing             Do not wear make-up, lotions, powders, perfumes/cologne, or deodorant  Do not wear nail polish including gel and S&S, artificial/acrylic nails, or any other type of covering on natural nails including finger and toenails. If you have artificial nails, gel coating, etc. that needs to be removed by a nail salon please have this removed prior to surgery or surgery may need to be canceled/ delayed if the surgeon/ anesthesia feels like they are unable to be safely monitored.   Do not shave  48 hours prior to surgery.    Do not bring valuables to the hospital. [place] IS NOT             RESPONSIBLE   FOR VALUABLES.   Contacts, dentures or bridgework may not be worn into surgery.   Bring small overnight bag day of surgery.   DO NOT BRING YOUR HOME MEDICATIONS TO THE HOSPITAL. PHARMACY WILL DISPENSE MEDICATIONS LISTED ON YOUR MEDICATION LIST TO YOU DURING YOUR ADMISSION IN THE HOSPITAL!    Patients discharged on the day of surgery will not be allowed to drive home.  Someone NEEDS to stay with you for the first 24 hours after anesthesia.   Special Instructions: Bring a copy of your healthcare power of attorney and living will documents         the day of surgery if you haven't scanned them before.              Please read over the following fact sheets you were given: IF YOU HAVE QUESTIONS ABOUT YOUR PRE-OP INSTRUCTIONS PLEASE CALL [phone]     [facility]  Health - Preparing for Surgery Before surgery, you can play an important role.  Because skin is not sterile, your skin needs to be as free of germs as possible.  You can reduce the number of germs on your skin by washing with CHG (chlorahexidine gluconate) soap before surgery.  CHG is an antiseptic cleaner which kills germs and bonds with the skin to continue killing germs even after washing. Please DO NOT use if you have an allergy to CHG or antibacterial soaps.  If your skin becomes reddened/irritated stop using the CHG and inform your nurse when you arrive at Short Stay. Do not shave (including legs and underarms) for at least 48 hours prior to the first CHG shower.  You may shave your face/neck. Please follow these instructions carefully:  1.  Shower with CHG Soap the night before surgery and the  morning of Surgery.  2.  If you choose to wash your hair, wash your hair first as usual with your  normal  shampoo.  3.  After you shampoo, rinse your hair and body thoroughly to remove the  shampoo.                           4.  Use CHG as you would any other liquid soap.  You can apply chg directly  to the  skin and wash                       Gently with a scrungie or clean washcloth.  5.  Apply the CHG Soap to your body ONLY FROM THE NECK DOWN.   Do not use on face/ open                           Wound or open sores. Avoid contact with eyes, ears mouth and genitals (private parts).                       Wash face,  Genitals (private parts) with your normal soap.             6.  Wash thoroughly, paying special attention to the area where your surgery  will be performed.  7.  Thoroughly rinse your body with warm water from the neck down.  8.  DO NOT shower/wash with your normal soap after using and rinsing off  the CHG Soap.                9.  Pat yourself dry with a clean towel.            10.  Wear clean pajamas.            11.  Place clean sheets on your bed the night of your first shower and do not   sleep with pets. Day of Surgery : Do not apply any lotions/deodorants the morning of surgery.  Please wear clean clothes to the hospital/surgery center.  FAILURE TO FOLLOW THESE INSTRUCTIONS MAY RESULT IN THE CANCELLATION OF YOUR SURGERY PATIENT SIGNATURE_________________________________  NURSE SIGNATURE__________________________________  ________________________________________________________________________    Taylor Barker  An incentive spirometer is a tool that can help keep your lungs clear and active. This tool measures how well you are filling your lungs with each breath. Taking long deep breaths may help reverse or decrease the chance of developing breathing (pulmonary) problems (especially infection) following: A long period of time when you are unable to move or be active. BEFORE THE PROCEDURE  If the spirometer includes an indicator to show your best effort, your nurse or respiratory therapist will set it to a desired goal. If possible, sit up straight or lean slightly forward. Try not to slouch. Hold the incentive spirometer in an upright position. INSTRUCTIONS FOR USE  Sit on the edge of your bed if possible, or sit up as far as you can in bed or on a chair. Hold the incentive spirometer in an upright position. Breathe out normally. Place the mouthpiece in your mouth and seal your lips tightly around it. Breathe in slowly and as deeply as possible, raising the piston or the ball toward the top of the column. Hold your breath for 3-5 seconds or for as long as possible. Allow the piston or ball to fall to the bottom of the column. Remove the mouthpiece from your mouth and breathe out normally. Rest for a few seconds and repeat Steps 1 through 7 at least 10 times every 1-2 hours when you are awake. Take your time and take a few normal breaths between deep breaths. The spirometer may include an indicator to show your best effort. Use the indicator as a goal to work  toward during each repetition. After each set of 10 deep breaths, practice coughing to be sure your lungs are clear.  If you have an incision (the cut made at the time of surgery), support your incision when coughing by placing a pillow or rolled up towels firmly against it. Once you are able to get out of bed, walk around indoors and cough well. You may stop using the incentive spirometer when instructed by your caregiver.  RISKS AND COMPLICATIONS Take your time so you do not get dizzy or light-headed. If you are in pain, you may need to take or ask for pain medication before doing incentive spirometry. It is harder to take a deep breath if you are having pain. AFTER USE Rest and breathe slowly and easily. It can be helpful to keep track of a log of your progress. Your caregiver can provide you with a simple table to help with this. If you are using the spirometer at home, follow these instructions: [place] IF:  You are having difficultly using the spirometer. You have trouble using the spirometer as often as instructed. Your pain medication is not giving enough relief while using the spirometer. You develop fever of 100.5 F (38.1 C) or higher. SEEK IMMEDIATE MEDICAL CARE IF:  You cough up bloody sputum that had not been present before. You develop fever of 102 F (38.9 C) or greater. You develop worsening pain at or near the incision site. MAKE SURE YOU:  Understand these instructions. Will watch your condition. Will get help right away if you are not doing well or get worse. Document Released: 01/16/2007 Document Revised: 11/28/2011 Document Reviewed: 03/19/2007 [facility] Patient Information 2014 [place], [place].   ________________________________________________________________________

## 2022-04-08 ENCOUNTER — Other Ambulatory Visit: Payer: Self-pay | Admitting: Physician Assistant

## 2022-04-08 DIAGNOSIS — M1611 Unilateral primary osteoarthritis, right hip: Secondary | ICD-10-CM

## 2022-04-11 ENCOUNTER — Other Ambulatory Visit: Payer: Self-pay

## 2022-04-11 ENCOUNTER — Encounter (HOSPITAL_COMMUNITY)
Admission: RE | Admit: 2022-04-11 | Discharge: 2022-04-11 | Disposition: A | Discharge status: Home / Self Care | Payer: Medicare Other | Source: Ambulatory Visit | Attending: Orthopaedic Surgery | Admitting: Orthopaedic Surgery

## 2022-04-11 ENCOUNTER — Encounter (HOSPITAL_COMMUNITY): Payer: Self-pay

## 2022-04-11 VITALS — BP 157/84 | HR 90 | Temp 98.0°F | Ht 62.0 in | Wt 95.0 lb

## 2022-04-11 DIAGNOSIS — Z01812 Encounter for preprocedural laboratory examination: Secondary | ICD-10-CM | POA: Diagnosis present

## 2022-04-11 DIAGNOSIS — Z01818 Encounter for other preprocedural examination: Secondary | ICD-10-CM

## 2022-04-11 HISTORY — DX: Unspecified osteoarthritis, unspecified site: M19.90

## 2022-04-11 LAB — CBC
HCT: 38.8 % (ref 36.0–46.0)
Hemoglobin: 12.9 g/dL (ref 12.0–15.0)
MCH: 32.3 pg (ref 26.0–34.0)
MCHC: 33.2 g/dL (ref 30.0–36.0)
MCV: 97 fL (ref 80.0–100.0)
Platelets: 289 10*3/uL (ref 150–400)
RBC: 4 MIL/uL (ref 3.87–5.11)
RDW: 11.9 % (ref 11.5–15.5)
WBC: 4.7 10*3/uL (ref 4.0–10.5)
nRBC: 0 % (ref 0.0–0.2)

## 2022-04-11 LAB — SURGICAL PCR SCREEN
MRSA, PCR: NEGATIVE
Staphylococcus aureus: NEGATIVE

## 2022-04-11 NOTE — Progress Notes (Signed)
For Short Stay: COVID SWAB appointment date: Date of COVID positive in last 90 days:  Bowel Prep reminder:   For Anesthesia: PCP - Dr. Vicente Serene  Cardiologist -   Chest x-ray -  EKG -  Stress Test -  ECHO -  Cardiac Cath -  Pacemaker/ICD device last checked: Pacemaker orders received: Device Rep notified:  Spinal Cord Stimulator:  Sleep Study -  CPAP -   Fasting Blood Sugar -  Checks Blood Sugar _____ times a day Date and result of last Hgb A1c-  Blood Thinner Instructions: Aspirin Instructions: Last Dose:  Activity level: Can go up a flight of stairs and activities of daily living without stopping and without chest pain and/or shortness of breath   Able to exercise without chest pain and/or shortness of breath   Unable to go up a flight of stairs without chest pain and/or shortness of breath     Anesthesia review:   Patient denies shortness of breath, fever, cough and chest pain at PAT appointment   Patient verbalized understanding of instructions that were given to them at the PAT appointment. Patient was also instructed that they will need to review over the PAT instructions again at home before surgery.

## 2022-04-14 NOTE — H&P (Signed)
TOTAL HIP ADMISSION H&P  Patient is admitted for right total hip arthroplasty.  Subjective:  Chief Complaint: right hip pain  HPI: Taylor Barker, 81 y.o. female, has a history of pain and functional disability in the right hip(s) due to arthritis and patient has failed non-surgical conservative treatments for greater than 12 weeks to include NSAID's and/or analgesics, corticosteriod injections, flexibility and strengthening excercises, use of assistive devices, and activity modification.  Onset of symptoms was gradual starting 2 years ago with gradually worsening course since that time.The patient noted no past surgery on the right hip(s).  Patient currently rates pain in the right hip at 10 out of 10 with activity. Patient has night pain, worsening of pain with activity and weight bearing, trendelenberg gait, pain that interfers with activities of daily living, and pain with passive range of motion. Patient has evidence of subchondral cysts, subchondral sclerosis, periarticular osteophytes, and joint space narrowing by imaging studies. This condition presents safety issues increasing the risk of falls.  There is no current active infection.  Patient Active Problem List   Diagnosis Date Noted   Unilateral primary osteoarthritis, right hip 12/20/2021   Past Medical History:  Diagnosis Date   Arthritis    Glaucoma     Past Surgical History:  Procedure Laterality Date   BACK SURGERY     EYE SURGERY     LUMBAR LAMINECTOMY/DECOMPRESSION MICRODISCECTOMY Right 08/18/2021   Procedure: Laminectomy and Foraminotomy - Lumbar three-Lumbar four - Lumbar four-Lumbar five - right;  Surgeon: Tia Alert, MD;  Location: [facility] OR;  Service: Neurosurgery;  Laterality: Right;  3C   WISDOM TOOTH EXTRACTION  1977    No current facility-administered medications for this encounter.   Current Outpatient Medications  Medication Sig Dispense Refill Last Dose   acetaminophen (TYLENOL) 500 MG tablet Take 500  mg by mouth every 6 (six) hours as needed for moderate pain.      Biotin 5000 MCG TABS Take 5,000 mcg by mouth daily.      Calcium-Vitamin D-Vitamin K (VIACTIV CALCIUM PLUS D) 650-12.5-40 MG-MCG-MCG CHEW Chew 1 each by mouth in the morning and at bedtime.      cholecalciferol (VITAMIN D3) 25 MCG (1000 UNIT) tablet Take 1,000 Units by mouth 2 (two) times a week.      fexofenadine (ALLEGRA) 180 MG tablet Take 180 mg by mouth daily as needed for allergies or rhinitis.      latanoprost (XALATAN) 0.005 % ophthalmic solution Place 1 drop into both eyes at bedtime.      risedronate (ACTONEL) 150 MG tablet Take 150 mg by mouth every 30 (thirty) days.      Allergies  Allergen Reactions   Flonase [Fluticasone]     Nose bleeds    Bee Pollen Cough   Fosamax [Alendronate] Rash    Pt felt lousy     Social History   Tobacco Use   Smoking status: Never   Smokeless tobacco: Never  Substance Use Topics   Alcohol use: Yes    Comment: occas.    No family history on file.   Review of Systems  Musculoskeletal:  Positive for back pain and gait problem.  All other systems reviewed and are negative.   Objective:  Physical Exam Vitals reviewed.  Constitutional:      Appearance: Normal appearance.  HENT:     Head: Normocephalic and atraumatic.  Eyes:     Extraocular Movements: Extraocular movements intact.     Pupils: Pupils are equal, round,  and reactive to light.  Cardiovascular:     Rate and Rhythm: Normal rate.     Pulses: Normal pulses.  Pulmonary:     Effort: Pulmonary effort is normal.     Breath sounds: Normal breath sounds.  Abdominal:     Palpations: Abdomen is soft.  Musculoskeletal:     Cervical back: Normal range of motion.     Right hip: Tenderness and bony tenderness present. Decreased range of motion. Decreased strength.  Neurological:     Mental Status: She is alert and oriented to person, place, and time.  Psychiatric:        Behavior: Behavior normal.     Vital  signs in last 24 hours:    Labs:   Estimated body mass index is 17.38 kg/m as calculated from the following:   Height as of 04/11/22: 5\' 2"  (1.575 m).   Weight as of 04/11/22: 43.1 kg.   Imaging Review Plain radiographs demonstrate severe degenerative joint disease of the right hip(s). The bone quality appears to be good for age and reported activity level.      Assessment/Plan:  End stage arthritis, right hip(s)  The patient history, physical examination, clinical judgement of the provider and imaging studies are consistent with end stage degenerative joint disease of the right hip(s) and total hip arthroplasty is deemed medically necessary. The treatment options including medical management, injection therapy, arthroscopy and arthroplasty were discussed at length. The risks and benefits of total hip arthroplasty were presented and reviewed. The risks due to aseptic loosening, infection, stiffness, dislocation/subluxation,  thromboembolic complications and other imponderables were discussed.  The patient acknowledged the explanation, agreed to proceed with the plan and consent was signed. Patient is being admitted for inpatient treatment for surgery, pain control, PT, OT, prophylactic antibiotics, VTE prophylaxis, progressive ambulation and ADL's and discharge planning.The patient is planning to be discharged home with home health services

## 2022-04-15 ENCOUNTER — Encounter (HOSPITAL_COMMUNITY): Payer: Self-pay | Admitting: Orthopaedic Surgery

## 2022-04-15 ENCOUNTER — Ambulatory Visit (HOSPITAL_BASED_OUTPATIENT_CLINIC_OR_DEPARTMENT_OTHER): Payer: Medicare Other | Admitting: Anesthesiology

## 2022-04-15 ENCOUNTER — Observation Stay (HOSPITAL_COMMUNITY)
Admission: RE | Admit: 2022-04-15 | Discharge: 2022-04-17 | Disposition: A | Discharge status: Home / Self Care | Payer: Medicare Other | Attending: Orthopaedic Surgery | Admitting: Orthopaedic Surgery

## 2022-04-15 ENCOUNTER — Ambulatory Visit (HOSPITAL_COMMUNITY): Payer: Medicare Other | Admitting: Anesthesiology

## 2022-04-15 ENCOUNTER — Encounter (HOSPITAL_COMMUNITY)
Admission: RE | Disposition: A | Discharge status: Home / Self Care | Payer: Self-pay | Source: Home / Self Care | Attending: Orthopaedic Surgery

## 2022-04-15 ENCOUNTER — Observation Stay (HOSPITAL_COMMUNITY): Payer: Medicare Other

## 2022-04-15 ENCOUNTER — Ambulatory Visit (HOSPITAL_COMMUNITY): Payer: Medicare Other

## 2022-04-15 ENCOUNTER — Other Ambulatory Visit: Payer: Self-pay

## 2022-04-15 DIAGNOSIS — Z79899 Other long term (current) drug therapy: Secondary | ICD-10-CM | POA: Diagnosis not present

## 2022-04-15 DIAGNOSIS — M1611 Unilateral primary osteoarthritis, right hip: Secondary | ICD-10-CM | POA: Diagnosis present

## 2022-04-15 DIAGNOSIS — Z96641 Presence of right artificial hip joint: Secondary | ICD-10-CM

## 2022-04-15 HISTORY — PX: TOTAL HIP ARTHROPLASTY: SHX124

## 2022-04-15 LAB — TYPE AND SCREEN
ABO/RH(D): A POS
Antibody Screen: NEGATIVE

## 2022-04-15 LAB — GLUCOSE, CAPILLARY: Glucose-Capillary: 127 mg/dL — ABNORMAL HIGH (ref 70–99)

## 2022-04-15 LAB — ABO/RH: ABO/RH(D): A POS

## 2022-04-15 SURGERY — ARTHROPLASTY, HIP, TOTAL, ANTERIOR APPROACH
Anesthesia: Monitor Anesthesia Care | Site: Hip | Laterality: Right

## 2022-04-15 MED ORDER — ONDANSETRON HCL 4 MG/2ML IJ SOLN: 4.0000 mg | Freq: Once | INTRAMUSCULAR | Status: DC | PRN

## 2022-04-15 MED ORDER — HYDROMORPHONE HCL 1 MG/ML IJ SOLN: 0.2500 mg | INTRAMUSCULAR | Status: DC | PRN

## 2022-04-15 MED ORDER — OXYCODONE HCL 5 MG/5ML PO SOLN: 5.0000 mg | Freq: Once | ORAL | Status: DC | PRN

## 2022-04-15 MED ORDER — BIOTIN 5000 MCG PO TABS: 5000.0000 ug | ORAL_TABLET | Freq: Every day | ORAL | Status: DC

## 2022-04-15 MED ORDER — STERILE WATER FOR IRRIGATION IR SOLN
Status: DC | PRN
  Administered 2022-04-15: 2000 mL

## 2022-04-15 MED ORDER — SODIUM CHLORIDE 0.9 % IR SOLN
Status: DC | PRN
  Administered 2022-04-15: 1000 mL

## 2022-04-15 MED ORDER — DOCUSATE SODIUM 100 MG PO CAPS
100.0000 mg | ORAL_CAPSULE | Freq: Two times a day (BID) | ORAL | Status: DC
  Administered 2022-04-15 – 2022-04-17 (×4): 100 mg via ORAL
  Filled 2022-04-15 (×4): qty 1

## 2022-04-15 MED ORDER — HYDROCODONE-ACETAMINOPHEN 5-325 MG PO TABS
1.0000 | ORAL_TABLET | ORAL | Status: DC | PRN
  Administered 2022-04-15: 1 via ORAL
  Filled 2022-04-15: qty 1

## 2022-04-15 MED ORDER — PROPOFOL 10 MG/ML IV BOLUS
INTRAVENOUS | Status: AC
  Filled 2022-04-15: qty 20

## 2022-04-15 MED ORDER — ACETAMINOPHEN 325 MG PO TABS: 650.0000 mg | ORAL_TABLET | Freq: Once | ORAL | Status: DC

## 2022-04-15 MED ORDER — POVIDONE-IODINE 10 % EX SWAB
2.0000 | Freq: Once | CUTANEOUS | Status: AC
  Administered 2022-04-15: 2 via TOPICAL

## 2022-04-15 MED ORDER — CHLORHEXIDINE GLUCONATE 0.12 % MT SOLN
15.0000 mL | Freq: Once | OROMUCOSAL | Status: AC
  Administered 2022-04-15: 15 mL via OROMUCOSAL

## 2022-04-15 MED ORDER — METHOCARBAMOL 500 MG PO TABS
500.0000 mg | ORAL_TABLET | Freq: Four times a day (QID) | ORAL | Status: DC | PRN
  Administered 2022-04-15 – 2022-04-16 (×2): 500 mg via ORAL
  Filled 2022-04-15 (×2): qty 1

## 2022-04-15 MED ORDER — HYDROCODONE-ACETAMINOPHEN 7.5-325 MG PO TABS
1.0000 | ORAL_TABLET | ORAL | Status: DC | PRN
  Administered 2022-04-16 – 2022-04-17 (×3): 1 via ORAL
  Filled 2022-04-15 (×3): qty 1

## 2022-04-15 MED ORDER — ONDANSETRON HCL 4 MG PO TABS
4.0000 mg | ORAL_TABLET | Freq: Four times a day (QID) | ORAL | Status: DC | PRN
  Administered 2022-04-15: 4 mg via ORAL
  Filled 2022-04-15: qty 1

## 2022-04-15 MED ORDER — AMISULPRIDE (ANTIEMETIC) 5 MG/2ML IV SOLN: 10.0000 mg | Freq: Once | INTRAVENOUS | Status: DC | PRN

## 2022-04-15 MED ORDER — PROPOFOL 500 MG/50ML IV EMUL
INTRAVENOUS | Status: DC | PRN
  Administered 2022-04-15: 50 ug/kg/min via INTRAVENOUS

## 2022-04-15 MED ORDER — ORAL CARE MOUTH RINSE: 15.0000 mL | Freq: Once | OROMUCOSAL | Status: AC

## 2022-04-15 MED ORDER — ONDANSETRON HCL 4 MG/2ML IJ SOLN
INTRAMUSCULAR | Status: DC | PRN
  Administered 2022-04-15: 4 mg via INTRAVENOUS

## 2022-04-15 MED ORDER — ONDANSETRON HCL 4 MG/2ML IJ SOLN
4.0000 mg | Freq: Four times a day (QID) | INTRAMUSCULAR | Status: DC | PRN
  Administered 2022-04-15: 4 mg via INTRAVENOUS
  Filled 2022-04-15: qty 2

## 2022-04-15 MED ORDER — DIPHENHYDRAMINE HCL 12.5 MG/5ML PO ELIX: 12.5000 mg | ORAL_SOLUTION | ORAL | Status: DC | PRN

## 2022-04-15 MED ORDER — ASPIRIN 81 MG PO CHEW
81.0000 mg | CHEWABLE_TABLET | Freq: Two times a day (BID) | ORAL | Status: DC
Start: 2022-04-15 — End: 2022-04-17
  Administered 2022-04-15 – 2022-04-17 (×4): 81 mg via ORAL
  Filled 2022-04-15 (×4): qty 1

## 2022-04-15 MED ORDER — 0.9 % SODIUM CHLORIDE (POUR BTL) OPTIME
TOPICAL | Status: DC | PRN
  Administered 2022-04-15: 1000 mL

## 2022-04-15 MED ORDER — MORPHINE SULFATE (PF) 2 MG/ML IV SOLN: 0.5000 mg | INTRAVENOUS | Status: DC | PRN

## 2022-04-15 MED ORDER — METHOCARBAMOL 500 MG IVPB - SIMPLE MED
500.0000 mg | Freq: Four times a day (QID) | INTRAVENOUS | Status: DC | PRN
  Administered 2022-04-15: 500 mg via INTRAVENOUS
  Filled 2022-04-15: qty 500

## 2022-04-15 MED ORDER — MENTHOL 3 MG MT LOZG: 1.0000 | LOZENGE | OROMUCOSAL | Status: DC | PRN

## 2022-04-15 MED ORDER — LATANOPROST 0.005 % OP SOLN
1.0000 [drp] | Freq: Every day | OPHTHALMIC | Status: DC
Start: 2022-04-15 — End: 2022-04-17
  Administered 2022-04-15 – 2022-04-16 (×2): 1 [drp] via OPHTHALMIC
  Filled 2022-04-15: qty 2.5

## 2022-04-15 MED ORDER — CEFAZOLIN SODIUM-DEXTROSE 1-4 GM/50ML-% IV SOLN
1.0000 g | Freq: Four times a day (QID) | INTRAVENOUS | Status: AC
  Administered 2022-04-15: 1 g via INTRAVENOUS
  Filled 2022-04-15: qty 50

## 2022-04-15 MED ORDER — CEFAZOLIN SODIUM-DEXTROSE 2-4 GM/100ML-% IV SOLN
2.0000 g | INTRAVENOUS | Status: AC
  Administered 2022-04-15: 2 g via INTRAVENOUS
  Filled 2022-04-15: qty 100

## 2022-04-15 MED ORDER — ACETAMINOPHEN 325 MG PO TABS: 325.0000 mg | ORAL_TABLET | Freq: Four times a day (QID) | ORAL | Status: DC | PRN

## 2022-04-15 MED ORDER — SODIUM CHLORIDE 0.9 % IV SOLN
6.2500 mg | Freq: Four times a day (QID) | INTRAVENOUS | Status: DC | PRN
  Filled 2022-04-15 (×6): qty 0.25

## 2022-04-15 MED ORDER — BUPIVACAINE IN DEXTROSE 0.75-8.25 % IT SOLN
INTRATHECAL | Status: DC | PRN
  Administered 2022-04-15: 1.4 mL via INTRATHECAL

## 2022-04-15 MED ORDER — LACTATED RINGERS IV SOLN: INTRAVENOUS | Status: DC

## 2022-04-15 MED ORDER — OXYCODONE HCL 5 MG PO TABS: 5.0000 mg | ORAL_TABLET | Freq: Once | ORAL | Status: DC | PRN

## 2022-04-15 MED ORDER — PHENYLEPHRINE HCL (PRESSORS) 10 MG/ML IV SOLN
INTRAVENOUS | Status: DC | PRN
  Administered 2022-04-15 (×2): 160 ug via INTRAVENOUS

## 2022-04-15 MED ORDER — PHENOL 1.4 % MT LIQD: 1.0000 | OROMUCOSAL | Status: DC | PRN

## 2022-04-15 MED ORDER — METOCLOPRAMIDE HCL 5 MG PO TABS: 5.0000 mg | ORAL_TABLET | Freq: Three times a day (TID) | ORAL | Status: DC | PRN

## 2022-04-15 MED ORDER — ALUM & MAG HYDROXIDE-SIMETH 200-200-20 MG/5ML PO SUSP: 30.0000 mL | ORAL | Status: DC | PRN

## 2022-04-15 MED ORDER — PANTOPRAZOLE SODIUM 40 MG PO TBEC
40.0000 mg | DELAYED_RELEASE_TABLET | Freq: Every day | ORAL | Status: DC
  Administered 2022-04-15 – 2022-04-17 (×3): 40 mg via ORAL
  Filled 2022-04-15 (×3): qty 1

## 2022-04-15 MED ORDER — TRANEXAMIC ACID-NACL 1000-0.7 MG/100ML-% IV SOLN
1000.0000 mg | INTRAVENOUS | Status: AC
  Administered 2022-04-15: 1000 mg via INTRAVENOUS
  Filled 2022-04-15: qty 100

## 2022-04-15 MED ORDER — EPHEDRINE SULFATE (PRESSORS) 50 MG/ML IJ SOLN
INTRAMUSCULAR | Status: DC | PRN
  Administered 2022-04-15: 10 mg via INTRAVENOUS

## 2022-04-15 MED ORDER — SODIUM CHLORIDE 0.9 % IV SOLN: INTRAVENOUS | Status: DC

## 2022-04-15 MED ORDER — FENTANYL CITRATE (PF) 100 MCG/2ML IJ SOLN
INTRAMUSCULAR | Status: AC
  Filled 2022-04-15: qty 2

## 2022-04-15 MED ORDER — TRAMADOL HCL 50 MG PO TABS
50.0000 mg | ORAL_TABLET | Freq: Four times a day (QID) | ORAL | Status: DC
  Administered 2022-04-15 – 2022-04-16 (×3): 50 mg via ORAL
  Filled 2022-04-15 (×3): qty 1

## 2022-04-15 MED ORDER — FENTANYL CITRATE (PF) 100 MCG/2ML IJ SOLN
INTRAMUSCULAR | Status: DC | PRN
  Administered 2022-04-15: 100 ug via INTRAVENOUS

## 2022-04-15 MED ORDER — METOCLOPRAMIDE HCL 5 MG/ML IJ SOLN
5.0000 mg | Freq: Three times a day (TID) | INTRAMUSCULAR | Status: DC | PRN
  Administered 2022-04-15: 10 mg via INTRAVENOUS
  Filled 2022-04-15: qty 2

## 2022-04-15 SURGICAL SUPPLY — 45 items
APL SKNCLS STERI-STRIP NONHPOA (GAUZE/BANDAGES/DRESSINGS)
BAG COUNTER SPONGE SURGICOUNT (BAG) ×2 IMPLANT
BAG SPEC THK2 15X12 ZIP CLS (MISCELLANEOUS)
BAG SPNG CNTER NS LX DISP (BAG) ×1
BAG ZIPLOCK 12X15 (MISCELLANEOUS) IMPLANT
BENZOIN TINCTURE PRP APPL 2/3 (GAUZE/BANDAGES/DRESSINGS) IMPLANT
BLADE SAW SGTL 18X1.27X75 (BLADE) ×2 IMPLANT
COVER PERINEAL POST (MISCELLANEOUS) ×2 IMPLANT
COVER SURGICAL LIGHT HANDLE (MISCELLANEOUS) ×2 IMPLANT
CUP ACET PINNACLE SECTR 48MM (Joint) IMPLANT
DRAPE FOOT SWITCH (DRAPES) ×2 IMPLANT
DRAPE STERI IOBAN 125X83 (DRAPES) ×2 IMPLANT
DRAPE U-SHAPE 47X51 STRL (DRAPES) ×4 IMPLANT
DRESSING AQUACEL AG SP 3.5X10 (GAUZE/BANDAGES/DRESSINGS) IMPLANT
DRSG AQUACEL AG ADV 3.5X10 (GAUZE/BANDAGES/DRESSINGS) ×2 IMPLANT
DRSG AQUACEL AG SP 3.5X10 (GAUZE/BANDAGES/DRESSINGS) ×2
DURAPREP 26ML APPLICATOR (WOUND CARE) ×2 IMPLANT
ELECT REM PT RETURN 15FT ADLT (MISCELLANEOUS) ×2 IMPLANT
GAUZE XEROFORM 1X8 LF (GAUZE/BANDAGES/DRESSINGS) ×2 IMPLANT
GLOVE BIO SURGEON STRL SZ7.5 (GLOVE) ×2 IMPLANT
GLOVE BIOGEL PI IND STRL 8 (GLOVE) ×2 IMPLANT
GLOVE BIOGEL PI INDICATOR 8 (GLOVE) ×2
GLOVE ECLIPSE 8.0 STRL XLNG CF (GLOVE) ×2 IMPLANT
GOWN STRL REUS W/ TWL XL LVL3 (GOWN DISPOSABLE) ×2 IMPLANT
GOWN STRL REUS W/TWL XL LVL3 (GOWN DISPOSABLE) ×4
HANDPIECE INTERPULSE COAX TIP (DISPOSABLE) ×2
HEAD FEM STD 32X+1 STRL (Hips) ×1 IMPLANT
HOLDER FOLEY CATH W/STRAP (MISCELLANEOUS) ×2 IMPLANT
KIT TURNOVER KIT A (KITS) IMPLANT
LINER ACET 32X48 (Liner) ×1 IMPLANT
PACK ANTERIOR HIP CUSTOM (KITS) ×2 IMPLANT
PINNSECTOR W/GRIP ACE CUP 48MM (Joint) ×2 IMPLANT
SET HNDPC FAN SPRY TIP SCT (DISPOSABLE) ×1 IMPLANT
STAPLER VISISTAT 35W (STAPLE) IMPLANT
STEM FEM SZ3 STD ACTIS (Stem) ×1 IMPLANT
STRIP CLOSURE SKIN 1/2X4 (GAUZE/BANDAGES/DRESSINGS) IMPLANT
SUT ETHIBOND NAB CT1 #1 30IN (SUTURE) ×2 IMPLANT
SUT ETHILON 2 0 PS N (SUTURE) IMPLANT
SUT MNCRL AB 4-0 PS2 18 (SUTURE) IMPLANT
SUT VIC AB 0 CT1 36 (SUTURE) ×2 IMPLANT
SUT VIC AB 1 CT1 36 (SUTURE) ×2 IMPLANT
SUT VIC AB 2-0 CT1 27 (SUTURE) ×4
SUT VIC AB 2-0 CT1 TAPERPNT 27 (SUTURE) ×2 IMPLANT
TRAY FOLEY MTR SLVR 16FR STAT (SET/KITS/TRAYS/PACK) IMPLANT
YANKAUER SUCT BULB TIP NO VENT (SUCTIONS) ×2 IMPLANT

## 2022-04-15 NOTE — Anesthesia Preprocedure Evaluation (Addendum)
Anesthesia Evaluation  Patient identified by MRN, date of birth, ID band Patient awake    Reviewed: Allergy & Precautions, NPO status , Patient's Chart, lab work & pertinent test results  Airway Mallampati: II  TM Distance: >3 FB Neck ROM: Full    Dental no notable dental hx.    Pulmonary neg pulmonary ROS,    Pulmonary exam normal breath sounds clear to auscultation       Cardiovascular negative cardio ROS Normal cardiovascular exam Rhythm:Regular Rate:Normal     Neuro/Psych negative neurological ROS  negative psych ROS   GI/Hepatic negative GI ROS, Neg liver ROS,   Endo/Other  negative endocrine ROS  Renal/GU negative Renal ROS  negative genitourinary   Musculoskeletal  (+) Arthritis , Osteoarthritis,  07/2021 L3-5 laminectomy   Abdominal   Peds  Hematology negative hematology ROS (+) Hb 12.9, plt 289   Anesthesia Other Findings   Reproductive/Obstetrics negative OB ROS                             Anesthesia Physical Anesthesia Plan  ASA: 2  Anesthesia Plan: Spinal and MAC   Post-op Pain Management: Tylenol PO (pre-op)*   Induction:   PONV Risk Score and Plan: 2 and Propofol infusion and TIVA  Airway Management Planned: Natural Airway and Nasal Cannula  Additional Equipment: None  Intra-op Plan:   Post-operative Plan:   Informed Consent: I have reviewed the patients History and Physical, chart, labs and discussed the procedure including the risks, benefits and alternatives for the proposed anesthesia with the patient or authorized representative who has indicated his/her understanding and acceptance.       Plan Discussed with: CRNA  Anesthesia Plan Comments:         Anesthesia Quick Evaluation

## 2022-04-15 NOTE — Evaluation (Signed)
Physical Therapy Evaluation Patient Details Name: Taylor Barker MRN: [id number] DOB: 05-17-1941 Today's Date: 04/15/2022  History of Present Illness  Pt s/p R THR and with hx of back surgery  Clinical Impression  Pt s/p R THR and presents with decreased R LE strength/ROM, post op pain and orthostatic hypotension limiting functional mobility - BP sitting 140/78 and after standing and returning to supine with c/o dizziness 110/61.  Pt should progress to dc home with family assist.     Recommendations for follow up therapy are one component of a multi-disciplinary discharge planning process, led by the attending physician.  Recommendations may be updated based on patient status, additional functional criteria and insurance authorization.  Follow Up Recommendations Follow physician's recommendations for discharge plan and follow up therapies      Assistance Recommended at Discharge Frequent or constant Supervision/Assistance  Patient can return home with the following  A lot of help with walking and/or transfers;A little help with bathing/dressing/bathroom;Assistance with cooking/housework;Assist for transportation;Help with stairs or ramp for entrance    Equipment Recommendations Rolling walker (2 wheels)  Recommendations for Other Services       Functional Status Assessment Patient has had a recent decline in their functional status and/or demonstrates limited ability to make significant improvements in function in a reasonable and predictable amount of time     Precautions / Restrictions Precautions Precautions: Fall Restrictions Weight Bearing Restrictions: No Other Position/Activity Restrictions: WBAT      Mobility  Bed Mobility Overal bed mobility: Needs Assistance Bed Mobility: Supine to Sit, Sit to Supine     Supine to sit: Min assist Sit to supine: Min assist, +2 for physical assistance, +2 for safety/equipment   General bed mobility comments: cues for sequence  and use of L LE to self assist.  Increased assist back to bed 2* orthostatic hypotension    Transfers Overall transfer level: Needs assistance Equipment used: Rolling walker (2 wheels) Transfers: Sit to/from Stand Sit to Stand: Min assist, Mod assist, +2 safety/equipment           General transfer comment: cues for LE management and use of UEs to self assist    Ambulation/Gait Ambulation/Gait assistance: Min assist, +2 physical assistance, +2 safety/equipment Gait Distance (Feet): 3 Feet Assistive device: Rolling walker (2 wheels) Gait Pattern/deviations: Step-to pattern, Decreased step length - right, Decreased step length - left, Shuffle, Trunk flexed Gait velocity: decr     General Gait Details: pt side-stepped up side of bed only 2* increasing dizziness  Stairs            Wheelchair Mobility    Modified Rankin (Stroke Patients Only)       Balance Overall balance assessment: Needs assistance Sitting-balance support: No upper extremity supported, Feet supported Sitting balance-Leahy Scale: Fair     Standing balance support: Bilateral upper extremity supported Standing balance-Leahy Scale: Poor                               Pertinent Vitals/Pain Pain Assessment Pain Assessment: 0-10 Pain Score: 3  Pain Location: R hip Pain Descriptors / Indicators: Aching, Sore Pain Intervention(s): Limited activity within patient's tolerance, Monitored during session, Premedicated before session, Ice applied    Home Living Family/patient expects to be discharged to:: Private residence Living Arrangements: Spouse/significant other Available Help at Discharge: Family Type of Home: House Home Access: Stairs to enter Entrance Stairs-Rails: Right Entrance Stairs-Number of Steps: 2vs6 Alternate Level Stairs-Number  of Steps: flight Home Layout: Two level;Able to live on main level with bedroom/bathroom Home Equipment: Gilmer Mor - single point      Prior  Function Prior Level of Function : Independent/Modified Independent                     Hand Dominance        Extremity/Trunk Assessment   Upper Extremity Assessment Upper Extremity Assessment: Overall WFL for tasks assessed    Lower Extremity Assessment Lower Extremity Assessment: RLE deficits/detail    Cervical / Trunk Assessment Cervical / Trunk Assessment: Normal  Communication   Communication: No difficulties  Cognition Arousal/Alertness: Awake/alert Behavior During Therapy: WFL for tasks assessed/performed Overall Cognitive Status: Within Functional Limits for tasks assessed                                          General Comments      Exercises Total Joint Exercises Ankle Circles/Pumps: AROM, Both, 15 reps, Supine   Assessment/Plan    PT Assessment Patient needs continued PT services  PT Problem List Decreased strength;Decreased range of motion;Decreased activity tolerance;Decreased balance;Decreased mobility;Decreased knowledge of use of DME;Pain       PT Treatment Interventions DME instruction;Gait training;Stair training;Functional mobility training;Therapeutic activities;Therapeutic exercise;Patient/family education    PT Goals (Current goals can be found in the Care Plan section)  Acute Rehab PT Goals Patient Stated Goal: Regain IND PT Goal Formulation: With patient Time For Goal Achievement: 04/28/22 Potential to Achieve Goals: Good    Frequency 7X/week     Co-evaluation               AM-PAC PT "6 Clicks" Mobility  Outcome Measure Help needed turning from your back to your side while in a flat bed without using bedrails?: A Little Help needed moving from lying on your back to sitting on the side of a flat bed without using bedrails?: A Little Help needed moving to and from a bed to a chair (including a wheelchair)?: A Little Help needed standing up from a chair using your arms (e.g., wheelchair or bedside  chair)?: A Lot Help needed to walk in hospital room?: Total Help needed climbing 3-5 steps with a railing? : Total 6 Click Score: 13    End of Session Equipment Utilized During Treatment: Gait belt Activity Tolerance: Other (comment) (orthostatic) Patient left: in bed;with call bell/phone within reach;with family/visitor present Nurse Communication: Mobility status PT Visit Diagnosis: Unsteadiness on feet (R26.81);Difficulty in walking, not elsewhere classified (R26.2);Pain Pain - Right/Left: Right Pain - part of body: Hip    Time: 1425-1450 PT Time Calculation (min) (ACUTE ONLY): 25 min   Charges:   PT Evaluation $PT Eval Low Complexity: 1 Low PT Treatments $Therapeutic Activity: 8-22 mins        Taylor Barker PT Acute Rehabilitation Services Pager [phone] Office [phone]   Taylor Barker 04/15/2022, 3:29 PM

## 2022-04-15 NOTE — Anesthesia Postprocedure Evaluation (Signed)
Anesthesia Post Note  Patient: Taylor Barker  Procedure(s) Performed: RIGHT TOTAL HIP ARTHROPLASTY ANTERIOR APPROACH (Right: Hip)     Patient location during evaluation: PACU Anesthesia Type: MAC and Spinal Level of consciousness: awake and alert and oriented Pain management: pain level controlled Vital Signs Assessment: post-procedure vital signs reviewed and stable Respiratory status: spontaneous breathing, nonlabored ventilation and respiratory function stable Cardiovascular status: blood pressure returned to baseline and stable Postop Assessment: no headache, no backache, spinal receding and patient able to bend at knees Anesthetic complications: no   No notable events documented.  Last Vitals:  Vitals:   04/15/22 0930 04/15/22 0945  BP: (!) 111/98 125/62  Pulse: 64 (!) 58  Resp: 19 16  Temp:    SpO2: 100% 100%    Last Pain:  Vitals:   04/15/22 0945  TempSrc:   PainSc: 0-No pain                 Pervis Hocking

## 2022-04-15 NOTE — Anesthesia Procedure Notes (Signed)
Spinal  Patient location during procedure: OR Start time: 04/15/2022 7:14 AM End time: 04/15/2022 7:16 AM Reason for block: surgical anesthesia Staffing Performed: anesthesiologist  Anesthesiologist: Lannie Fields, DO Performed by: Lannie Fields, DO Authorized by: Lannie Fields, DO   Preanesthetic Checklist Completed: patient identified, IV checked, risks and benefits discussed, surgical consent, monitors and equipment checked, pre-op evaluation and timeout performed Spinal Block Patient position: sitting Prep: DuraPrep and site prepped and draped Patient monitoring: cardiac monitor, continuous pulse ox and blood pressure Approach: midline Location: L3-4 Injection technique: single-shot Needle Needle type: Pencan  Needle gauge: 24 G Needle length: 9 cm Assessment Sensory level: T6 Events: CSF return Additional Notes Functioning IV was confirmed and monitors were applied. Sterile prep and drape, including hand hygiene and sterile gloves were used. The patient was positioned and the spine was prepped. The skin was anesthetized with lidocaine.  Free flow of clear CSF was obtained prior to injecting local anesthetic into the CSF.  The spinal needle aspirated freely following injection.  The needle was carefully withdrawn.  The patient tolerated the procedure well.

## 2022-04-15 NOTE — Transfer of Care (Signed)
Immediate Anesthesia Transfer of Care Note  Patient: Taylor Barker  Procedure(s) Performed: RIGHT TOTAL HIP ARTHROPLASTY ANTERIOR APPROACH (Right: Hip)  Patient Location: PACU  Anesthesia Type:Spinal  Level of Consciousness: awake, alert  and oriented  Airway & Oxygen Therapy: Patient Spontanous Breathing and Patient connected to face mask oxygen  Post-op Assessment: Report given to RN and Post -op Vital signs reviewed and stable  Post vital signs: Reviewed and stable  Last Vitals:  Vitals Value Taken Time  BP 123/59 04/15/22 0831  Temp    Pulse 72 04/15/22 0832  Resp 13 04/15/22 0832  SpO2 100 % 04/15/22 0832  Vitals shown include unvalidated device data.  Last Pain:  Vitals:   04/15/22 0622  TempSrc:   PainSc: 0-No pain         Complications: No notable events documented.

## 2022-04-15 NOTE — Op Note (Signed)
Operative note  Date of operation: 04/15/2022 Preoperative diagnosis: Right hip osteoarthritis and degenerative joint disease Postoperative diagnosis: Same  Procedure: Right total hip arthroplasty through a direct anterior approach  Implants: DePuy Sectra GRIPTION acetabular component size 48, size 32+0 polythene liner, size 3 Actis femoral component with standard offset, size 32+1 metal hip ball  Surgeon: Vanita Panda. Magnus Ivan, MD Assistant: Rexene Edison, PA-C  Anesthesia: Spinal Antibiotics: 2 g IV Ancef EBL: 150 cc Complications: None  Indications: The patient is a 81 year old active female with well-documented debilitating arthritis involving her right hip.  This is evident on plain films and clinical exam.  She has tried and failed all forms of conservative treatment for over 12 months.  We have recommended the surgery.  Her right hip pain is daily and it is detrimentally affecting her mobility, her quality of life, and her actives daily living.  We did talk about the risk of acute blood loss anemia, nerve or vessel injury, fracture, infection, dislocation, implant failure, DVT, leg length differences as well as skin healing issues.  We discussed her goals of being decreased pain, improve mobility, and improve quality of life.  Procedure description: After informed consent was obtained and the appropriate right hip was marked she was brought to the operating room and set up on the stretcher where spinal anesthesia was obtained.  She was then placed in the supine position on the stretcher and a Foley catheter was placed as well as traction boots being placed on both her feet.  She was then placed supine on the Hana fracture table with a perineal post in place and both legs and inline skeletal traction devices but no traction applied.  Her right operative hip was assessed radiographically and then it was prepped and draped with DuraPrep and sterile drapes.  A timeout was called and she was  notified of the correct patient correct right hip I then made incision just inferior and posterior to the anterior superior iliac spine and carried this obliquely down the leg.  Dissected down to the tensor fascia lata muscle and the tensor fascia was then divided longitudinally to proceed with a direct anterior approach to the hip.  We identified and cauterized circumflex vessels and identified the hip capsule opened up the hip capsule type format finding a moderate joint effusion and synovitis around the hip joint.  Cobra retractors were placed around the medial lateral femoral neck and a femoral neck cut was made with an oscillating saw just proximal to the lesser trochanter and completed with an osteotome.  A corkscrew guide was placed in the femoral head and the femoral head was removed in its entirety and found a wide area devoid of cartilage.  I then placed a bent Hohmann over the medial acetabular rim and removed remnants of the acetabular labrum and other debris.  We then began reaming under direct visitation from a size 43 reamer and stepwise increments going up to a size 47 reamer with all reamers placed under direct visitation in the last reamer placed under direct fluoroscopy so we could obtain the depth reaming, or inclination and anteversion.  I then placed the real DePuy sector GRIPTION extra implant size 48 and a 32+0 neutral polyliner for that size acetabular component.  Attention was then turned to the femur.  The right leg was externally rotated extended and a deducted.  We then placed a medial retractor medially and a Hohmann tractor behind the greater trochanter.  I released the joint capsule and used  a box cutting osteotome to enter the femoral canal and a rongure to lateralize.  We then began broaching using the Actis broaching system from a size 0 going to a size 3 with a size 3 in place we trialed a standard offset femoral neck and a 32+1 trial hip ball.  We brought the leg back over and up  and with traction and internal rotation reduced in the pelvis.  We were pleased with leg length offset range of motion and stability assessment clinically and radiographically.  We then dislocated the hip and remove the trial components.  We then placed the real DePuy Actis femoral component size 3 with standard offset and the real 32+1 metal hip ball.  This again was reduced in the acetabulum and we are pleased leg length, offset, range of motion, and stability assessed mechanically and radiographically.  The soft tissue was then irrigated with normal saline solution.  The joint capsule was closed with interrupted #1 Ethibond suture.  A #1 Vicryl was used to close the tensor fascia.  0 Vicryl was used to close the deep tissue and 2-0 Vicryl used to close the subcutaneous tissue.  The skin was closed with staples.  An Aquacel dressing was applied.  She was taken recovery room in stable addition.  All final counts were correct and there were no complications noted.  Of note Rexene Edison, PA-C assisted in entire case from beginning and and his assistance was crucial for helping manage soft tissues and retract soft tissues, guide implant placement and he was directly involved with a layered closure of the wound.

## 2022-04-15 NOTE — Progress Notes (Signed)

## 2022-04-15 NOTE — Interval H&P Note (Signed)
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  There has been no acute or interval change in her medical status.  See H&P.  The risks and benefits of surgery have been explained in detail and informed consent is obtained.  The right operative hip has been marked.  04/15/2022 6:53 AM  Taylor Barker  has presented today for surgery, with the diagnosis of OSTEOARTHRITIS / DEGENERATIVE JOINT DISEASE RIGHT HIP.  The various methods of treatment have been discussed with the patient and family. After consideration of risks, benefits and other options for treatment, the patient has consented to  Procedure(s): RIGHT TOTAL HIP ARTHROPLASTY ANTERIOR APPROACH (Right) as a surgical intervention.  The patient's history has been reviewed, patient examined, no change in status, stable for surgery.  I have reviewed the patient's chart and labs.  Questions were answered to the patient's satisfaction.     Kathryne Hitch

## 2022-04-16 DIAGNOSIS — M1611 Unilateral primary osteoarthritis, right hip: Secondary | ICD-10-CM | POA: Diagnosis not present

## 2022-04-16 LAB — BASIC METABOLIC PANEL
Anion gap: 7 (ref 5–15)
BUN: 7 mg/dL — ABNORMAL LOW (ref 8–23)
CO2: 25 mmol/L (ref 22–32)
Calcium: 7.6 mg/dL — ABNORMAL LOW (ref 8.9–10.3)
Chloride: 98 mmol/L (ref 98–111)
Creatinine, Ser: 0.51 mg/dL (ref 0.44–1.00)
GFR, Estimated: >60 mL/min (ref >60)
Glucose, Bld: 101 mg/dL — ABNORMAL HIGH (ref 70–99)
Potassium: 3.6 mmol/L (ref 3.5–5.1)
Sodium: 130 mmol/L — ABNORMAL LOW (ref 135–145)

## 2022-04-16 LAB — CBC
HCT: 32.5 % — ABNORMAL LOW (ref 36.0–46.0)
Hemoglobin: 10.7 g/dL — ABNORMAL LOW (ref 12.0–15.0)
MCH: 31.9 pg (ref 26.0–34.0)
MCHC: 32.9 g/dL (ref 30.0–36.0)
MCV: 97 fL (ref 80.0–100.0)
Platelets: 237 10*3/uL (ref 150–400)
RBC: 3.35 MIL/uL — ABNORMAL LOW (ref 3.87–5.11)
RDW: 11.7 % (ref 11.5–15.5)
WBC: 7 10*3/uL (ref 4.0–10.5)
nRBC: 0 % (ref 0.0–0.2)

## 2022-04-16 MED ORDER — ASPIRIN 81 MG PO CHEW: 81.0000 mg | CHEWABLE_TABLET | Freq: Two times a day (BID) | ORAL | 0 refills | Status: DC

## 2022-04-16 MED ORDER — TRAMADOL HCL 50 MG PO TABS: 50.0000 mg | ORAL_TABLET | Freq: Four times a day (QID) | ORAL | 0 refills | Status: DC | PRN

## 2022-04-16 MED ORDER — TRAMADOL HCL 50 MG PO TABS
50.0000 mg | ORAL_TABLET | Freq: Four times a day (QID) | ORAL | Status: DC | PRN
  Administered 2022-04-16: 50 mg via ORAL
  Filled 2022-04-16: qty 1

## 2022-04-16 NOTE — Progress Notes (Signed)
Physical Therapy Treatment Patient Details Name: Taylor Barker MRN: [id number] DOB: April 12, 1941 Today's Date: 04/16/2022   History of Present Illness Pt s/p R THR and with hx of back surgery    PT Comments    Pt continues very cooperative but progressing slowly with mobility - pt fatigues easily.  Pt up to ambulate increased, but still limited distance, in hall and then assisted back to bed.  Pt states tired but pleased with progress.   Recommendations for follow up therapy are one component of a multi-disciplinary discharge planning process, led by the attending physician.  Recommendations may be updated based on patient status, additional functional criteria and insurance authorization.  Follow Up Recommendations  Follow physician's recommendations for discharge plan and follow up therapies     Assistance Recommended at Discharge Frequent or constant Supervision/Assistance  Patient can return home with the following A lot of help with walking and/or transfers;A little help with bathing/dressing/bathroom;Assistance with cooking/housework;Assist for transportation;Help with stairs or ramp for entrance   Equipment Recommendations  Rolling walker (2 wheels)    Recommendations for Other Services       Precautions / Restrictions Precautions Precautions: Fall Restrictions Weight Bearing Restrictions: No Other Position/Activity Restrictions: WBAT     Mobility  Bed Mobility Overal bed mobility: Needs Assistance Bed Mobility: Sit to Supine     Supine to sit: Min assist Sit to supine: Min assist   General bed mobility comments: increased time with cues for sequence and use of L LE to self assist    Transfers Overall transfer level: Needs assistance Equipment used: Rolling walker (2 wheels) Transfers: Sit to/from Stand Sit to Stand: Min assist, Mod assist           General transfer comment: cues for LE management and use of UEs to self assist     Ambulation/Gait Ambulation/Gait assistance: Min assist Gait Distance (Feet): 36 Feet Assistive device: Rolling walker (2 wheels) Gait Pattern/deviations: Step-to pattern, Decreased step length - right, Decreased step length - left, Shuffle, Trunk flexed Gait velocity: decr     General Gait Details: Increased time with cues for sequence, posture and position from RW; distance ltd by fatigue   Stairs             Wheelchair Mobility    Modified Rankin (Stroke Patients Only)       Balance Overall balance assessment: Needs assistance Sitting-balance support: No upper extremity supported, Feet supported Sitting balance-Leahy Scale: Fair     Standing balance support: Bilateral upper extremity supported Standing balance-Leahy Scale: Poor                              Cognition Arousal/Alertness: Awake/alert Behavior During Therapy: WFL for tasks assessed/performed Overall Cognitive Status: Within Functional Limits for tasks assessed                                          Exercises Total Joint Exercises Ankle Circles/Pumps: AROM, Both, 15 reps, Supine Quad Sets: AROM, Both, 10 reps, Supine Heel Slides: AAROM, Right, 15 reps, Supine Hip ABduction/ADduction: AAROM, Right, 15 reps, Supine    General Comments        Pertinent Vitals/Pain Pain Assessment Pain Assessment: 0-10 Pain Score: 5  Pain Location: R hip Pain Descriptors / Indicators: Aching, Sore Pain Intervention(s): Limited activity within patient's tolerance, Monitored during  session, Premedicated before session, Ice applied    Home Living                          Prior Function            PT Goals (current goals can now be found in the care plan section) Acute Rehab PT Goals Patient Stated Goal: Regain IND PT Goal Formulation: With patient Time For Goal Achievement: 04/28/22 Potential to Achieve Goals: Good Progress towards PT goals: Progressing  toward goals    Frequency    7X/week      PT Plan Current plan remains appropriate    Co-evaluation              AM-PAC PT "6 Clicks" Mobility   Outcome Measure  Help needed turning from your back to your side while in a flat bed without using bedrails?: A Little Help needed moving from lying on your back to sitting on the side of a flat bed without using bedrails?: A Little Help needed moving to and from a bed to a chair (including a wheelchair)?: A Little Help needed standing up from a chair using your arms (e.g., wheelchair or bedside chair)?: A Little Help needed to walk in hospital room?: A Little Help needed climbing 3-5 steps with a railing? : A Lot 6 Click Score: 17    End of Session Equipment Utilized During Treatment: Gait belt Activity Tolerance: Patient limited by fatigue Patient left: in bed;with call bell/phone within reach;with bed alarm set;with family/visitor present Nurse Communication: Mobility status PT Visit Diagnosis: Unsteadiness on feet (R26.81);Difficulty in walking, not elsewhere classified (R26.2);Pain Pain - Right/Left: Right Pain - part of body: Hip     Time: 9371-6967 PT Time Calculation (min) (ACUTE ONLY): 22 min  Charges:  $Gait Training: 8-22 mins                     [name] PT Acute Rehabilitation Services Pager [phone] Office [phone]    Lui Bellis 04/16/2022, 4:04 PM

## 2022-04-16 NOTE — Progress Notes (Signed)
Physical Therapy Treatment Patient Details Name: Taylor Barker MRN: [id number] DOB: 1941-09-05 Today's Date: 04/16/2022   History of Present Illness Pt s/p R THR and with hx of back surgery    PT Comments    Pt continues cooperative and with improved pain control.  Pt up to ambulate limited distance into hall - limited by fatigue but with no c/o dizziness this date.  Orthostatic BPs performed but with no issues and numbers provided to RN.   Recommendations for follow up therapy are one component of a multi-disciplinary discharge planning process, led by the attending physician.  Recommendations may be updated based on patient status, additional functional criteria and insurance authorization.  Follow Up Recommendations  Follow physician's recommendations for discharge plan and follow up therapies     Assistance Recommended at Discharge Frequent or constant Supervision/Assistance  Patient can return home with the following A lot of help with walking and/or transfers;A little help with bathing/dressing/bathroom;Assistance with cooking/housework;Assist for transportation;Help with stairs or ramp for entrance   Equipment Recommendations  Rolling walker (2 wheels)    Recommendations for Other Services       Precautions / Restrictions Precautions Precautions: Fall Restrictions Weight Bearing Restrictions: No Other Position/Activity Restrictions: WBAT     Mobility  Bed Mobility Overal bed mobility: Needs Assistance Bed Mobility: Supine to Sit     Supine to sit: Min assist     General bed mobility comments: increased time with cues for sequence and use of L LE to self assist    Transfers Overall transfer level: Needs assistance Equipment used: Rolling walker (2 wheels) Transfers: Sit to/from Stand Sit to Stand: Min assist, Mod assist           General transfer comment: cues for LE management and use of UEs to self assist    Ambulation/Gait Ambulation/Gait  assistance: Min assist, +2 physical assistance, +2 safety/equipment Gait Distance (Feet): 18 Feet Assistive device: Rolling walker (2 wheels) Gait Pattern/deviations: Step-to pattern, Decreased step length - right, Decreased step length - left, Shuffle, Trunk flexed Gait velocity: decr     General Gait Details: Increased time with cues for sequence, posture and position from RW; distance ltd by fatigue   Stairs             Wheelchair Mobility    Modified Rankin (Stroke Patients Only)       Balance Overall balance assessment: Needs assistance Sitting-balance support: No upper extremity supported, Feet supported Sitting balance-Leahy Scale: Fair     Standing balance support: Bilateral upper extremity supported Standing balance-Leahy Scale: Poor                              Cognition Arousal/Alertness: Awake/alert Behavior During Therapy: WFL for tasks assessed/performed Overall Cognitive Status: Within Functional Limits for tasks assessed                                          Exercises Total Joint Exercises Ankle Circles/Pumps: AROM, Both, 15 reps, Supine Quad Sets: AROM, Both, 10 reps, Supine Heel Slides: AAROM, Right, 15 reps, Supine Hip ABduction/ADduction: AAROM, Right, 15 reps, Supine    General Comments        Pertinent Vitals/Pain Pain Assessment Pain Assessment: 0-10 Pain Score: 4  Pain Location: R hip Pain Descriptors / Indicators: Aching, Sore Pain Intervention(s): Limited activity within  patient's tolerance, Monitored during session, Premedicated before session, Ice applied    Home Living                          Prior Function            PT Goals (current goals can now be found in the care plan section) Acute Rehab PT Goals Patient Stated Goal: Regain IND PT Goal Formulation: With patient Time For Goal Achievement: 04/28/22 Potential to Achieve Goals: Good Progress towards PT goals:  Progressing toward goals    Frequency    7X/week      PT Plan Current plan remains appropriate    Co-evaluation              AM-PAC PT "6 Clicks" Mobility   Outcome Measure  Help needed turning from your back to your side while in a flat bed without using bedrails?: A Little Help needed moving from lying on your back to sitting on the side of a flat bed without using bedrails?: A Little Help needed moving to and from a bed to a chair (including a wheelchair)?: A Little Help needed standing up from a chair using your arms (e.g., wheelchair or bedside chair)?: A Little Help needed to walk in hospital room?: A Little Help needed climbing 3-5 steps with a railing? : A Lot 6 Click Score: 17    End of Session Equipment Utilized During Treatment: Gait belt Activity Tolerance: Patient limited by fatigue Patient left: in chair;with call bell/phone within reach;with chair alarm set Nurse Communication: Mobility status PT Visit Diagnosis: Unsteadiness on feet (R26.81);Difficulty in walking, not elsewhere classified (R26.2);Pain Pain - Right/Left: Right Pain - part of body: Hip     Time: 3244-0102 PT Time Calculation (min) (ACUTE ONLY): 16 min  Charges:  $Gait Training: 8-22 mins $Therapeutic Exercise: 8-22 mins                     [name] PT Acute Rehabilitation Services Pager [phone] Office [phone]    [name] 04/16/2022, 12:29 PM

## 2022-04-16 NOTE — Discharge Instructions (Signed)

## 2022-04-16 NOTE — TOC Transition Note (Signed)
dsTransition of Care [facility]) - CM/SW Discharge Note   Patient Details  Name: Taylor Barker MRN: [id number] Date of Birth: [birth year]  [facility]) CM/SW Contact:  Darleene Cleaver, LCSW Phone Number: 04/16/2022, 1:30 PM   Clinical Narrative:     Patient will be going home with home health through [facility] which was prearranged at physician office.  Patient requested a rolling walker, CSW spoke to [employer], and they will order one for patient to discharge back home on.  It will be delivered to room before she leaves.  CSW signing off please reconsult with any other social work needs, home health agency has been notified of planned discharge.   Final next level of care: Home w Home Health Services Barriers to Discharge: Barriers Resolved   Patient Goals and CMS Choice Patient states their goals for this hospitalization and ongoing recovery are:: To return back home with home health. CMS Medicare.gov Compare Post Acute Care list provided to:: Patient Choice offered to / list presented to : Patient  Discharge Placement                       Discharge Plan and Services                DME Arranged: Walker rolling DME Agency: [employer] Date DME Agency Contacted: 04/16/22 Time DME Agency Contacted: 1130 Representative spoke with at DME Agency: Leavy Cella HH Arranged: PT HH Agency: [facility]        Social Determinants of Health (SDOH) Interventions     Readmission Risk Interventions     No data to display

## 2022-04-16 NOTE — Progress Notes (Signed)
Subjective: 1 Day Post-Op Procedure(s) (LRB): RIGHT TOTAL HIP ARTHROPLASTY ANTERIOR APPROACH (Right) Patient reports pain as moderate.    Objective: Vital signs in last 24 hours: Temp:  [97.5 F (36.4 C)-98.4 F (36.9 C)] 98.4 F (36.9 C) (07/29 0558) Pulse Rate:  [58-100] 100 (07/29 0558) Resp:  [14-20] 17 (07/29 0558) BP: (90-137)/(54-98) 120/71 (07/29 0558) SpO2:  [97 %-100 %] 97 % (07/29 0558) Weight:  [47.3 kg] 47.3 kg (07/29 0028)  Intake/Output from previous day: 07/28 0701 - 07/29 0700 In: 2279.1 [P.O.:360; I.V.:1819.1; IV Piggyback:100] Out: 2300 [Urine:2150; Blood:150] Intake/Output this shift: No intake/output data recorded.  Recent Labs    04/16/22 0328  HGB 10.7*   Recent Labs    04/16/22 0328  WBC 7.0  RBC 3.35*  HCT 32.5*  PLT 237   Recent Labs    04/16/22 0328  NA 130*  K 3.6  CL 98  CO2 25  BUN 7*  CREATININE 0.51  GLUCOSE 101*  CALCIUM 7.6*   No results for input(s): "LABPT", "INR" in the last 72 hours.  Sensation intact distally Intact pulses distally Dorsiflexion/Plantar flexion intact Incision: dressing C/D/I   Assessment/Plan: 1 Day Post-Op Procedure(s) (LRB): RIGHT TOTAL HIP ARTHROPLASTY ANTERIOR APPROACH (Right) Up with therapy Plan for discharge tomorrow Discharge home with home health      Kathryne Hitch 04/16/2022, 9:06 AM

## 2022-04-16 NOTE — Progress Notes (Signed)
Physical Therapy Treatment Patient Details Name: Taylor Barker MRN: [id number] DOB: 28-Jun-1941 Today's Date: 04/16/2022   History of Present Illness Pt s/p R THR and with hx of back surgery    PT Comments    Pt requesting to attempt therapy with out premed.  Pt tolerated limited therex program with assist but then requesting meds. RN aware.  Will follow up post meds.   Recommendations for follow up therapy are one component of a multi-disciplinary discharge planning process, led by the attending physician.  Recommendations may be updated based on patient status, additional functional criteria and insurance authorization.  Follow Up Recommendations  Follow physician's recommendations for discharge plan and follow up therapies     Assistance Recommended at Discharge Frequent or constant Supervision/Assistance  Patient can return home with the following A lot of help with walking and/or transfers;A little help with bathing/dressing/bathroom;Assistance with cooking/housework;Assist for transportation;Help with stairs or ramp for entrance   Equipment Recommendations  Rolling walker (2 wheels)    Recommendations for Other Services       Precautions / Restrictions Precautions Precautions: Fall Restrictions Weight Bearing Restrictions: No Other Position/Activity Restrictions: WBAT     Mobility  Bed Mobility               General bed mobility comments: deferred until after pain meds    Transfers                        Ambulation/Gait                   Stairs             Wheelchair Mobility    Modified Rankin (Stroke Patients Only)       Balance                                            Cognition Arousal/Alertness: Awake/alert Behavior During Therapy: WFL for tasks assessed/performed Overall Cognitive Status: Within Functional Limits for tasks assessed                                           Exercises Total Joint Exercises Ankle Circles/Pumps: AROM, Both, 15 reps, Supine Quad Sets: AROM, Both, 10 reps, Supine Heel Slides: AAROM, Right, 15 reps, Supine Hip ABduction/ADduction: AAROM, Right, 15 reps, Supine    General Comments        Pertinent Vitals/Pain Pain Assessment Pain Assessment: 0-10 Pain Score: 7  Pain Location: R hip Pain Descriptors / Indicators: Aching, Sore Pain Intervention(s): Limited activity within patient's tolerance, Monitored during session, Ice applied, Patient requesting pain meds-RN notified    Home Living                          Prior Function            PT Goals (current goals can now be found in the care plan section) Acute Rehab PT Goals Patient Stated Goal: Regain IND PT Goal Formulation: With patient Time For Goal Achievement: 04/28/22 Potential to Achieve Goals: Good Progress towards PT goals: Progressing toward goals    Frequency    7X/week      PT Plan Current plan remains appropriate  Co-evaluation              AM-PAC PT "6 Clicks" Mobility   Outcome Measure  Help needed turning from your back to your side while in a flat bed without using bedrails?: A Little Help needed moving from lying on your back to sitting on the side of a flat bed without using bedrails?: A Little Help needed moving to and from a bed to a chair (including a wheelchair)?: A Little Help needed standing up from a chair using your arms (e.g., wheelchair or bedside chair)?: A Lot Help needed to walk in hospital room?: Total Help needed climbing 3-5 steps with a railing? : Total 6 Click Score: 13    End of Session   Activity Tolerance: Patient limited by pain Patient left: in bed;with call bell/phone within reach Nurse Communication: Patient requests pain meds PT Visit Diagnosis: Unsteadiness on feet (R26.81);Difficulty in walking, not elsewhere classified (R26.2);Pain Pain - Right/Left: Right Pain - part of body: Hip      Time: 0917-0932 PT Time Calculation (min) (ACUTE ONLY): 15 min  Charges:  $Therapeutic Exercise: 8-22 mins                     [name] PT Acute Rehabilitation Services Pager [phone] Office [phone]    Magnus Crescenzo 04/16/2022, 9:42 AM

## 2022-04-17 ENCOUNTER — Other Ambulatory Visit: Payer: Self-pay | Admitting: Specialist

## 2022-04-17 DIAGNOSIS — M1611 Unilateral primary osteoarthritis, right hip: Secondary | ICD-10-CM | POA: Diagnosis not present

## 2022-04-17 MED ORDER — TIZANIDINE HCL 2 MG PO TABS: 2.0000 mg | ORAL_TABLET | Freq: Four times a day (QID) | ORAL | 0 refills | Status: DC | PRN

## 2022-04-17 MED ORDER — HYDROCODONE-ACETAMINOPHEN 7.5-325 MG PO TABS: 1.0000 | ORAL_TABLET | Freq: Four times a day (QID) | ORAL | 0 refills | Status: DC | PRN

## 2022-04-17 MED ORDER — ONDANSETRON HCL 4 MG PO TABS: 4.0000 mg | ORAL_TABLET | Freq: Three times a day (TID) | ORAL | 0 refills | Status: DC | PRN

## 2022-04-17 NOTE — Progress Notes (Signed)
Patient already has a walker for home delivered to room. Patient is also requesting a 3-in-1 for home. Having the 3-in-1 in addition to the walker will aid in patient's mobility and progress with recovery from hip surgery.

## 2022-04-17 NOTE — Plan of Care (Signed)
  Problem: Education: Goal: Knowledge of General Education information will improve Description Including pain rating scale, medication(s)/side effects and non-pharmacologic comfort measures Outcome: Progressing   Problem: Health Behavior/Discharge Planning: Goal: Ability to manage health-related needs will improve Outcome: Progressing   

## 2022-04-17 NOTE — Progress Notes (Signed)
Physical Therapy Treatment Patient Details Name: Taylor Barker MRN: [id number] DOB: 1941/07/10 Today's Date: 04/17/2022   History of Present Illness Pt s/p R THR and with hx of back surgery    PT Comments    Pt continues very cooperative and making steady progress with mobility but continues to fatigue easily with activity.  Pt performed therex progam with assist and up to ambulate increased, but still limited distance in hall.   Recommendations for follow up therapy are one component of a multi-disciplinary discharge planning process, led by the attending physician.  Recommendations may be updated based on patient status, additional functional criteria and insurance authorization.  Follow Up Recommendations  Follow physician's recommendations for discharge plan and follow up therapies     Assistance Recommended at Discharge Frequent or constant Supervision/Assistance  Patient can return home with the following A lot of help with walking and/or transfers;A little help with bathing/dressing/bathroom;Assistance with cooking/housework;Assist for transportation;Help with stairs or ramp for entrance   Equipment Recommendations  Rolling walker (2 wheels)    Recommendations for Other Services       Precautions / Restrictions Precautions Precautions: Fall Restrictions Weight Bearing Restrictions: No Other Position/Activity Restrictions: WBAT     Mobility  Bed Mobility               General bed mobility comments: Pt up in chair and requests back to same    Transfers Overall transfer level: Needs assistance Equipment used: Rolling walker (2 wheels) Transfers: Sit to/from Stand Sit to Stand: Min assist, Mod assist           General transfer comment: cues for LE management and use of UEs to self assist    Ambulation/Gait Ambulation/Gait assistance: Min assist Gait Distance (Feet): 58 Feet Assistive device: Rolling walker (2 wheels) Gait Pattern/deviations:  Step-to pattern, Decreased step length - right, Decreased step length - left, Shuffle, Trunk flexed Gait velocity: decr     General Gait Details: Increased time with cues for sequence, posture and position from RW; distance ltd by fatigue   Stairs             Wheelchair Mobility    Modified Rankin (Stroke Patients Only)       Balance Overall balance assessment: Needs assistance Sitting-balance support: No upper extremity supported, Feet supported Sitting balance-Leahy Scale: Good     Standing balance support: Bilateral upper extremity supported Standing balance-Leahy Scale: Poor                              Cognition Arousal/Alertness: Awake/alert Behavior During Therapy: WFL for tasks assessed/performed Overall Cognitive Status: Within Functional Limits for tasks assessed                                          Exercises Total Joint Exercises Ankle Circles/Pumps: AROM, Both, 15 reps, Supine Quad Sets: AROM, Both, 10 reps, Supine Heel Slides: AAROM, Right, Supine, 20 reps Hip ABduction/ADduction: AAROM, Right, 15 reps, Supine    General Comments        Pertinent Vitals/Pain Pain Assessment Pain Assessment: 0-10 Pain Score: 5  Pain Location: R hip Pain Descriptors / Indicators: Aching, Sore Pain Intervention(s): Limited activity within patient's tolerance, Monitored during session, Premedicated before session, Ice applied    Home Living  Prior Function            PT Goals (current goals can now be found in the care plan section) Acute Rehab PT Goals Patient Stated Goal: Regain IND PT Goal Formulation: With patient Time For Goal Achievement: 04/28/22 Potential to Achieve Goals: Good Progress towards PT goals: Progressing toward goals    Frequency    7X/week      PT Plan Current plan remains appropriate    Co-evaluation              AM-PAC PT "6 Clicks" Mobility    Outcome Measure  Help needed turning from your back to your side while in a flat bed without using bedrails?: A Little Help needed moving from lying on your back to sitting on the side of a flat bed without using bedrails?: A Little Help needed moving to and from a bed to a chair (including a wheelchair)?: A Little Help needed standing up from a chair using your arms (e.g., wheelchair or bedside chair)?: A Little Help needed to walk in hospital room?: A Little Help needed climbing 3-5 steps with a railing? : A Lot 6 Click Score: 17    End of Session Equipment Utilized During Treatment: Gait belt Activity Tolerance: Patient limited by fatigue Patient left: in chair;with call bell/phone within reach;with chair alarm set Nurse Communication: Mobility status PT Visit Diagnosis: Unsteadiness on feet (R26.81);Difficulty in walking, not elsewhere classified (R26.2);Pain Pain - Right/Left: Right Pain - part of body: Hip     Time: 2841-3244 PT Time Calculation (min) (ACUTE ONLY): 25 min  Charges:  $Gait Training: 8-22 mins $Therapeutic Exercise: 8-22 mins                     [name] PT Acute Rehabilitation Services Pager [phone] Office [phone]    Zacchaeus Halm 04/17/2022, 11:16 AM

## 2022-04-17 NOTE — Progress Notes (Signed)
Physical Therapy Treatment Patient Details Name: Taylor Barker MRN: [id number] DOB: [birth year] Today's Date: 04/17/2022   History of Present Illness Pt s/p R THR and with hx of back surgery    PT Comments    Pt in good spirits and up to ambulate limited distance in hall, negotiated stairs, reviewed bed mobility, reviewed written HEP, and reviewed car transfers.  Pt's dtr and husband present and all feel comfortable with dc home this date.   Recommendations for follow up therapy are one component of a multi-disciplinary discharge planning process, led by the attending physician.  Recommendations may be updated based on patient status, additional functional criteria and insurance authorization.  Follow Up Recommendations  Follow physician's recommendations for discharge plan and follow up therapies     Assistance Recommended at Discharge Frequent or constant Supervision/Assistance  Patient can return home with the following A lot of help with walking and/or transfers;A little help with bathing/dressing/bathroom;Assistance with cooking/housework;Assist for transportation;Help with stairs or ramp for entrance   Equipment Recommendations  Rolling walker (2 wheels)    Recommendations for Other Services       Precautions / Restrictions Precautions Precautions: Fall Restrictions Weight Bearing Restrictions: No Other Position/Activity Restrictions: WBAT     Mobility  Bed Mobility Overal bed mobility: Needs Assistance Bed Mobility: Sit to Supine       Sit to supine: Min guard   General bed mobility comments: Pt self assisting R LE with L LE    Transfers Overall transfer level: Needs assistance Equipment used: Rolling walker (2 wheels) Transfers: Sit to/from Stand Sit to Stand: Min assist, Min guard           General transfer comment: cues for LE management and use of UEs to self assist    Ambulation/Gait Ambulation/Gait assistance: Min guard Gait Distance  (Feet): 50 Feet Assistive device: Rolling walker (2 wheels) Gait Pattern/deviations: Step-to pattern, Decreased step length - right, Decreased step length - left, Shuffle, Trunk flexed Gait velocity: decr     General Gait Details: Increased time with cues for sequence, posture and position from RW; distance ltd by fatigue   Stairs Stairs: Yes Stairs assistance: Min assist Stair Management: One rail Right, Step to pattern, Forwards, With cane Number of Stairs: 5 General stair comments: 2+3 stairs with rail, cane and cues for sequence and foot/cane placement.   Wheelchair Mobility    Modified Rankin (Stroke Patients Only)       Balance Overall balance assessment: Needs assistance Sitting-balance support: No upper extremity supported, Feet supported Sitting balance-Leahy Scale: Good     Standing balance support: Bilateral upper extremity supported Standing balance-Leahy Scale: Poor                              Cognition Arousal/Alertness: Awake/alert Behavior During Therapy: WFL for tasks assessed/performed Overall Cognitive Status: Within Functional Limits for tasks assessed                                          Exercises Total Joint Exercises Ankle Circles/Pumps: AROM, Both, 15 reps, Supine Quad Sets: AROM, Both, 10 reps, Supine Heel Slides: AAROM, Right, Supine, 20 reps Hip ABduction/ADduction: AAROM, Right, 15 reps, Supine    General Comments        Pertinent Vitals/Pain Pain Assessment Pain Assessment: 0-10 Pain Score: 4  Pain Location:  R hip Pain Descriptors / Indicators: Aching, Sore Pain Intervention(s): Limited activity within patient's tolerance, Monitored during session, Premedicated before session, Ice applied    Home Living                          Prior Function            PT Goals (current goals can now be found in the care plan section) Acute Rehab PT Goals Patient Stated Goal: Regain IND PT  Goal Formulation: With patient Time For Goal Achievement: 04/28/22 Potential to Achieve Goals: Good Progress towards PT goals: Progressing toward goals    Frequency    7X/week      PT Plan Current plan remains appropriate    Co-evaluation              AM-PAC PT "6 Clicks" Mobility   Outcome Measure  Help needed turning from your back to your side while in a flat bed without using bedrails?: A Little Help needed moving from lying on your back to sitting on the side of a flat bed without using bedrails?: A Little Help needed moving to and from a bed to a chair (including a wheelchair)?: A Little Help needed standing up from a chair using your arms (e.g., wheelchair or bedside chair)?: A Little Help needed to walk in hospital room?: A Little Help needed climbing 3-5 steps with a railing? : A Little 6 Click Score: 18    End of Session Equipment Utilized During Treatment: Gait belt Activity Tolerance: Patient limited by fatigue;Patient tolerated treatment well Patient left: in bed;with call bell/phone within reach;with family/visitor present Nurse Communication: Mobility status PT Visit Diagnosis: Unsteadiness on feet (R26.81);Difficulty in walking, not elsewhere classified (R26.2);Pain Pain - Right/Left: Right Pain - part of body: Hip     Time: 1320-1350 PT Time Calculation (min) (ACUTE ONLY): 30 min  Charges:  $Gait Training: 8-22 mins $Therapeutic Activity: 8-22 mins                     Taylor Barker PT Acute Rehabilitation Services Pager [phone] Office [phone]    Taylor Barker 04/17/2022, 3:52 PM

## 2022-04-17 NOTE — Progress Notes (Signed)
     Subjective: 2 Days Post-Op Procedure(s) (LRB): RIGHT TOTAL HIP ARTHROPLASTY ANTERIOR APPROACH (Right) Awake, alert and oriented x 4. Dressing right hip intact with scant dry blood spots. Right thigh with mild swelling. PT yet to work with stairs, has 2 step to enter house and plans to live on 1st floor till more steady on feet and strength is improved.   Patient reports pain as moderate.    Objective:   VITALS:  Temp:  [98.4 F (36.9 C)-98.7 F (37.1 C)] 98.5 F (36.9 C) (07/30 0627) Pulse Rate:  [84-99] 99 (07/30 0627) Resp:  [17-18] 18 (07/30 0627) BP: (125-135)/(59-71) 133/70 (07/30 0627) SpO2:  [100 %] 100 % (07/30 0627)  Neurologically intact ABD soft Neurovascular intact Sensation intact distally Intact pulses distally Dorsiflexion/Plantar flexion intact Incision: dressing C/D/I and scant drainage   LABS Recent Labs    04/16/22 0328  HGB 10.7*  WBC 7.0  PLT 237   Recent Labs    04/16/22 0328  NA 130*  K 3.6  CL 98  CO2 25  BUN 7*  CREATININE 0.51  GLUCOSE 101*   No results for input(s): "LABPT", "INR" in the last 72 hours.   Assessment/Plan: 2 Days Post-Op Procedure(s) (LRB): RIGHT TOTAL HIP ARTHROPLASTY ANTERIOR APPROACH (Right)  Advance diet Up with therapy D/C IV fluids Discharge home with home health when she is able to demonstrate stair walking.   Vira Browns 04/17/2022, 10:29 AM Patient ID: Taylor Barker, female   DOB: 1940-10-10, 81 y.o.   MRN: [id number]

## 2022-04-17 NOTE — Progress Notes (Signed)
The patient is alert and oriented and has been seen by her physician. The orders for discharge were written. IV has been removed. Went over discharge instructions with patient and family. She is being discharged via wheelchair with all of her belongings.  

## 2022-04-18 ENCOUNTER — Telehealth: Payer: Self-pay

## 2022-04-18 ENCOUNTER — Other Ambulatory Visit: Payer: Self-pay | Admitting: Orthopaedic Surgery

## 2022-04-18 ENCOUNTER — Encounter (HOSPITAL_COMMUNITY): Payer: Self-pay | Admitting: Orthopaedic Surgery

## 2022-04-18 MED ORDER — TRAMADOL HCL 50 MG PO TABS: 50.0000 mg | ORAL_TABLET | Freq: Four times a day (QID) | ORAL | 0 refills | Status: DC | PRN

## 2022-04-18 NOTE — Telephone Encounter (Signed)
Call in Tramadol to her [facility] N[facility]ka called in Hydrocodone which she says is too strong and cancelled the Tramadol before she ever picked it up. She would like that instead

## 2022-04-18 NOTE — Discharge Summary (Signed)
Patient ID: Taylor Barker MRN: [id number] DOB/AGE: 1941-02-10 81 y.o.  Admit date: 04/15/2022 Discharge date: 04/18/2022  Admission Diagnoses:  Principal Problem:   Unilateral primary osteoarthritis, right hip Active Problems:   Status post total replacement of right hip   Discharge Diagnoses:  Same  Past Medical History:  Diagnosis Date   Arthritis    Glaucoma     Surgeries: Procedure(s): RIGHT TOTAL HIP ARTHROPLASTY ANTERIOR APPROACH on 04/15/2022   Consultants:   Discharged Condition: Improved  Hospital Course: Taylor Barker is an 81 y.o. female who was admitted 04/15/2022 for operative treatment ofUnilateral primary osteoarthritis, right hip. Patient has severe unremitting pain that affects sleep, daily activities, and work/hobbies. After pre-op clearance the patient was taken to the operating room on 04/15/2022 and underwent  Procedure(s): RIGHT TOTAL HIP ARTHROPLASTY ANTERIOR APPROACH.    Patient was given perioperative antibiotics:  Anti-infectives (From admission, onward)    Start     Dose/Rate Route Frequency Ordered Stop   04/15/22 1200  ceFAZolin (ANCEF) IVPB 1 g/50 mL premix        1 g 100 mL/hr over 30 Minutes Intravenous Every 6 hours 04/15/22 1058 04/15/22 2359   04/15/22 0600  ceFAZolin (ANCEF) IVPB 2g/100 mL premix        2 g 200 mL/hr over 30 Minutes Intravenous On call to O.R. 04/15/22 7829 04/15/22 5621        Patient was given sequential compression devices, early ambulation, and chemoprophylaxis to prevent DVT.  Patient benefited maximally from hospital stay and there were no complications.    Recent vital signs: No data found.   Recent laboratory studies:  Recent Labs    04/16/22 0328  WBC 7.0  HGB 10.7*  HCT 32.5*  PLT 237  NA 130*  K 3.6  CL 98  CO2 25  BUN 7*  CREATININE 0.51  GLUCOSE 101*  CALCIUM 7.6*     Discharge Medications:   Allergies as of 04/17/2022       Reactions   Flonase [fluticasone]    Nose bleeds     Bee Pollen Cough   Fosamax [alendronate] Rash   Pt felt lousy         Medication List     TAKE these medications    acetaminophen 500 MG tablet Commonly known as: TYLENOL Take 500 mg by mouth every 6 (six) hours as needed for moderate pain.   aspirin 81 MG chewable tablet Chew 1 tablet (81 mg total) by mouth 2 (two) times daily.   Biotin 5000 MCG Tabs Take 5,000 mcg by mouth daily.   cholecalciferol 25 MCG (1000 UNIT) tablet Commonly known as: VITAMIN D3 Take 1,000 Units by mouth 2 (two) times a week.   fexofenadine 180 MG tablet Commonly known as: ALLEGRA Take 180 mg by mouth daily as needed for allergies or rhinitis.   latanoprost 0.005 % ophthalmic solution Commonly known as: XALATAN Place 1 drop into both eyes at bedtime.   risedronate 150 MG tablet Commonly known as: ACTONEL Take 150 mg by mouth every 30 (thirty) days.   traMADol 50 MG tablet Commonly known as: ULTRAM Take 1-2 tablets (50-100 mg total) by mouth every 6 (six) hours as needed.   Viactiv Calcium Plus D 650-12.5-40 MG-MCG-MCG Chew Generic drug: Calcium-Vitamin D-Vitamin K Chew 1 each by mouth in the morning and at bedtime.        Diagnostic Studies: DG Pelvis Portable  Result Date: 04/15/2022 CLINICAL DATA:  Right hip replacement EXAM: PORTABLE  PELVIS 1-2 VIEWS COMPARISON:  None Available. FINDINGS: No acute fracture or dislocation. No aggressive osseous lesion. Normal alignment. Generalized osteopenia. Right total hip arthroplasty with postsurgical changes in the overlying soft tissues. Soft tissue are unremarkable. No radiopaque foreign body or soft tissue emphysema. IMPRESSION: 1. Right total hip arthroplasty. Electronically Signed   By: Elige KoHetal  Patel M.D.   On: 04/15/2022 08:56   DG HIP UNILAT WITH PELVIS 1V RIGHT  Result Date: 04/15/2022 CLINICAL DATA:  Right hip replacement EXAM: DG HIP (WITH OR WITHOUT PELVIS) 1V RIGHT COMPARISON:  None Available. FINDINGS: Multiple intraoperative  fluoroscopic spot images are provided. Interval right total hip arthroplasty. FLUOROSCOPY TIME:  Radiation Exposure Index: 0.97 mGy IMPRESSION: Interval right total hip arthroplasty. Electronically Signed   By: Elige KoHetal  Patel M.D.   On: 04/15/2022 08:16   DG C-Arm 1-60 Min-No Report  Result Date: 04/15/2022 Fluoroscopy was utilized by the requesting physician.  No radiographic interpretation.    Disposition: Discharge disposition: 01-Home or Self Care       Discharge Instructions     Call MD / Call 911   Complete by: As directed    If you experience chest pain or shortness of breath, CALL 911 and be transported to the hospital emergency room.  If you develope a fever above 101 F, pus (white drainage) or increased drainage or redness at the wound, or calf pain, call your surgeon's office.   Constipation Prevention   Complete by: As directed    Drink plenty of fluids.  Prune juice may be helpful.  You may use a stool softener, such as Colace (over the counter) 100 mg twice a day.  Use MiraLax (over the counter) for constipation as needed.   Diet - low sodium heart healthy   Complete by: As directed    Discharge instructions   Complete by: As directed    INSTRUCTIONS AFTER JOINT REPLACEMENT   o Remove items at home which could result in a fall. This includes throw rugs or furniture in walking pathways o ICE to the affected joint every three hours while awake for 30 minutes at a time, for at least the first 3-5 days, and then as needed for pain and swelling.  Continue to use ice for pain and swelling. You may notice swelling that will progress down to the foot and ankle.  This is normal after surgery.  Elevate your leg when you are not up walking on it.   o Continue to use the breathing machine you got in the hospital (incentive spirometer) which will help keep your temperature down.  It is common for your temperature to cycle up and down following surgery, especially at night when you are not  up moving around and exerting yourself.  The breathing machine keeps your lungs expanded and your temperature down.   DIET:  As you were doing prior to hospitalization, we recommend a well-balanced diet.  DRESSING / WOUND CARE / SHOWERING  Keep the surgical dressing until follow up.  The dressing is water proof, so you can shower without any extra covering.  IF THE DRESSING FALLS OFF or the wound gets wet inside, change the dressing with sterile gauze.  Please use good hand washing techniques before changing the dressing.  Do not use any lotions or creams on the incision until instructed by your surgeon.    ACTIVITY  o Increase activity slowly as tolerated, but follow the weight bearing instructions below.   o No driving for 6 weeks or  until further direction given by your physician.  You cannot drive while taking narcotics.  o No lifting or carrying greater than 10 lbs. until further directed by your surgeon. o Avoid periods of inactivity such as sitting longer than an hour when not asleep. This helps prevent blood clots.  o You may return to work once you are authorized by your doctor.     WEIGHT BEARING   Weight bearing as tolerated with assist device (walker, cane, etc) as directed, use it as long as suggested by your surgeon or therapist, typically at least 4-6 weeks.   EXERCISES  Results after joint replacement surgery are often greatly improved when you follow the exercise, range of motion and muscle strengthening exercises prescribed by your doctor. Safety measures are also important to protect the joint from further injury. Any time any of these exercises cause you to have increased pain or swelling, decrease what you are doing until you are comfortable again and then slowly increase them. If you have problems or questions, call your caregiver or physical therapist for advice.   Rehabilitation is important following a joint replacement. After just a few days of immobilization,  the muscles of the leg can become weakened and shrink (atrophy).  These exercises are designed to build up the tone and strength of the thigh and leg muscles and to improve motion. Often times heat used for twenty to thirty minutes before working out will loosen up your tissues and help with improving the range of motion but do not use heat for the first two weeks following surgery (sometimes heat can increase post-operative swelling).   These exercises can be done on a training (exercise) mat, on the floor, on a table or on a bed. Use whatever works the best and is most comfortable for you.    Use music or television while you are exercising so that the exercises are a pleasant break in your day. This will make your life better with the exercises acting as a break in your routine that you can look forward to.   Perform all exercises about fifteen times, three times per day or as directed.  You should exercise both the operative leg and the other leg as well.  Exercises include:    Quad Sets - Tighten up the muscle on the front of the thigh (Quad) and hold for 5-10 seconds.    Straight Leg Raises - With your knee straight (if you were given a brace, keep it on), lift the leg to 60 degrees, hold for 3 seconds, and slowly lower the leg.  Perform this exercise against resistance later as your leg gets stronger.   Leg Slides: Lying on your back, slowly slide your foot toward your buttocks, bending your knee up off the floor (only go as far as is comfortable). Then slowly slide your foot back down until your leg is flat on the floor again.   Angel Wings: Lying on your back spread your legs to the side as far apart as you can without causing discomfort.   Hamstring Strength:  Lying on your back, push your heel against the floor with your leg straight by tightening up the muscles of your buttocks.  Repeat, but this time bend your knee to a comfortable angle, and push your heel against the floor.  You may put  a pillow under the heel to make it more comfortable if necessary.   A rehabilitation program following joint replacement surgery can speed recovery and prevent  re-injury in the future due to weakened muscles. Contact your doctor or a physical therapist for more information on knee rehabilitation.    CONSTIPATION  Constipation is defined medically as fewer than three stools per week and severe constipation as less than one stool per week.  Even if you have a regular bowel pattern at home, your normal regimen is likely to be disrupted due to multiple reasons following surgery.  Combination of anesthesia, postoperative narcotics, change in appetite and fluid intake all can affect your bowels.   YOU MUST use at least one of the following options; they are listed in order of increasing strength to get the job done.  They are all available over the counter, and you may need to use some, POSSIBLY even all of these options:    Drink plenty of fluids (prune juice may be helpful) and high fiber foods Colace 100 mg by mouth twice a day  Senokot for constipation as directed and as needed Dulcolax (bisacodyl), take with full glass of water  Miralax (polyethylene glycol) once or twice a day as needed.  If you have tried all these things and are unable to have a bowel movement in the first 3-4 days after surgery call either your surgeon or your primary doctor.    If you experience loose stools or diarrhea, hold the medications until you stool forms back up.  If your symptoms do not get better within 1 week or if they get worse, check with your doctor.  If you experience "the worst abdominal pain ever" or develop nausea or vomiting, please contact the office immediately for further recommendations for treatment.   ITCHING:  If you experience itching with your medications, try taking only a single pain pill, or even half a pain pill at a time.  You can also use Benadryl over the counter for itching or also to  help with sleep.   TED HOSE STOCKINGS:  Use stockings on both legs until for at least 2 weeks or as directed by physician office. They may be removed at night for sleeping.  MEDICATIONS:  See your medication summary on the "After Visit Summary" that nursing will review with you.  You may have some home medications which will be placed on hold until you complete the course of blood thinner medication.  It is important for you to complete the blood thinner medication as prescribed.  PRECAUTIONS:  If you experience chest pain or shortness of breath - call 911 immediately for transfer to the hospital emergency department.   If you develop a fever greater that 101 F, purulent drainage from wound, increased redness or drainage from wound, foul odor from the wound/dressing, or calf pain - CONTACT YOUR SURGEON.                                                   FOLLOW-UP APPOINTMENTS:  If you do not already have a post-op appointment, please call the office for an appointment to be seen by your surgeon.  Guidelines for how soon to be seen are listed in your "After Visit Summary", but are typically between 1-4 weeks after surgery.  OTHER INSTRUCTIONS:   Knee Replacement:  Do not place pillow under knee, focus on keeping the knee straight while resting. CPM instructions: 0-90 degrees, 2 hours in the morning, 2 hours in the  afternoon, and 2 hours in the evening. Place foam block, curve side up under heel at all times except when in CPM or when walking.  DO NOT modify, tear, cut, or change the foam block in any way.  POST-OPERATIVE OPIOID TAPER INSTRUCTIONS:  It is important to wean off of your opioid medication as soon as possible. If you do not need pain medication after your surgery it is ok to stop day one.  Opioids include:  o Codeine, Hydrocodone(Norco, Vicodin), Oxycodone(Percocet, oxycontin) and hydromorphone amongst others.   Long term and even short term use of opiods can cause:  o Increased  pain response  o Dependence  o Constipation  o Depression  o Respiratory depression  o And more.   Withdrawal symptoms can include  o Flu like symptoms  o Nausea, vomiting  o And more  Techniques to manage these symptoms  o Hydrate well  o Eat regular healthy meals  o Stay active  o Use relaxation techniques(deep breathing, meditating, yoga)  Do Not substitute Alcohol to help with tapering  If you have been on opioids for less than two weeks and do not have pain than it is ok to stop all together.   Plan to wean off of opioids  o This plan should start within one week post op of your joint replacement.  o Maintain the same interval or time between taking each dose and first decrease the dose.   o Cut the total daily intake of opioids by one tablet each day  o Next start to increase the time between doses.  o The last dose that should be eliminated is the evening dose.   MAKE SURE YOU:   Understand these instructions.   Get help right away if you are not doing well or get worse.    Thank you for letting us be a part of your medical care team.  It is a privilege we respect greatly.  We hope these instructions will help you stay on track for a fast and full recovery!     Dental Antibiotics:  In most cases prophylactic antibiotics for Dental procdeures after total joint surgery are not necessary.  Exceptions are as follows:  1. History of prior total joint infection  2. Severely immunocompromised (Organ Transplant, cancer chemotherapy, Rheumatoid biologic meds such as Humera)  3. Poorly controlled diabetes (A1C &gt; 8.0, blood glucose over 200)  If you have one of these conditions, contact your surgeon for an antibiotic prescription, prior to your dental procedure.   Increase activity slowly as tolerated   Complete by: As directed    Post-operative opioid taper instructions:   Complete by: As directed    POST-OPERATIVE OPIOID TAPER INSTRUCTIONS: It is important  to wean off of your opioid medication as soon as possible. If you do not need pain medication after your surgery it is ok to stop day one. Opioids include: Codeine, Hydrocodone(Norco, Vicodin), Oxycodone(Percocet, oxycontin) and hydromorphone amongst others.  Long term and even short term use of opiods can cause: Increased pain response Dependence Constipation Depression Respiratory depression And more.  Withdrawal symptoms can include Flu like symptoms Nausea, vomiting And more Techniques to manage these symptoms Hydrate well Eat regular healthy meals Stay active Use relaxation techniques(deep breathing, meditating, yoga) Do Not substitute Alcohol to help with tapering If you have been on opioids for less than two weeks and do not have pain than it is ok to stop all together.  Plan to wean off of opioids  This plan should start within one week post op of your joint replacement. Maintain the same interval or time between taking each dose and first decrease the dose.  Cut the total daily intake of opioids by one tablet each day Next start to increase the time between doses. The last dose that should be eliminated is the evening dose.           Follow-up Information     Kathryne Hitch, MD Follow up in 2 week(s).   Specialty: Orthopedic Surgery Contact information: [address] [place] [address] [phone]         [facility] Follow up.   Specialty: Home Health Services Why: [facility] will call you to setup the first visit. Contact information: [address] [place] [address] [phone]                  Signed: Richardean Canal 04/18/2022, 8:20 AM

## 2022-04-28 ENCOUNTER — Ambulatory Visit (INDEPENDENT_AMBULATORY_CARE_PROVIDER_SITE_OTHER): Payer: Medicare Other | Admitting: Physician Assistant

## 2022-04-28 ENCOUNTER — Encounter: Payer: Self-pay | Admitting: Physician Assistant

## 2022-04-28 DIAGNOSIS — Z96641 Presence of right artificial hip joint: Secondary | ICD-10-CM

## 2022-04-28 NOTE — Progress Notes (Signed)
HPI: Taylor Barker returns today status post right total hip arthroplasty 04/15/2022.  She is overall doing well.  She is only taking Tylenol for pain.  She denies any fevers chills or shortness of breath.  She is still on aspirin for DVT prophylaxis.  Physical exam: Right hip surgical incisions healing well no signs of infection.  Right calf supple nontender.  Ambulates with a rolling walker.  Impression: Status post right total hip arthroplasty 04/15/2022.  Plan: Scar tissue mobilization encouraged.  She will continue work on range of motion and strengthening.  She will take aspirin for 1 week and then discontinue.  Questions were encouraged and answered follow-up in 1 month sooner if there is any concerns.

## 2022-05-16 ENCOUNTER — Telehealth: Payer: Self-pay | Admitting: Orthopaedic Surgery

## 2022-05-16 NOTE — Telephone Encounter (Signed)
Patient called asked if she is suppose to continue taking the low dose aspirin.   The number to contact patient is  [phone]

## 2022-05-16 NOTE — Telephone Encounter (Signed)
LMOM for patient that she does not have to take this anymore

## 2022-05-18 IMAGING — CR DG LUMBAR SPINE 1V
1 series · 1 of 1 positions shown · non-contrast
Comparison: 08/17/2016

CLINICAL DATA: Laminectomy and foraminotomy L3-4 and L4-5

EXAM:
LUMBAR SPINE - 1 VIEW

[lateral]
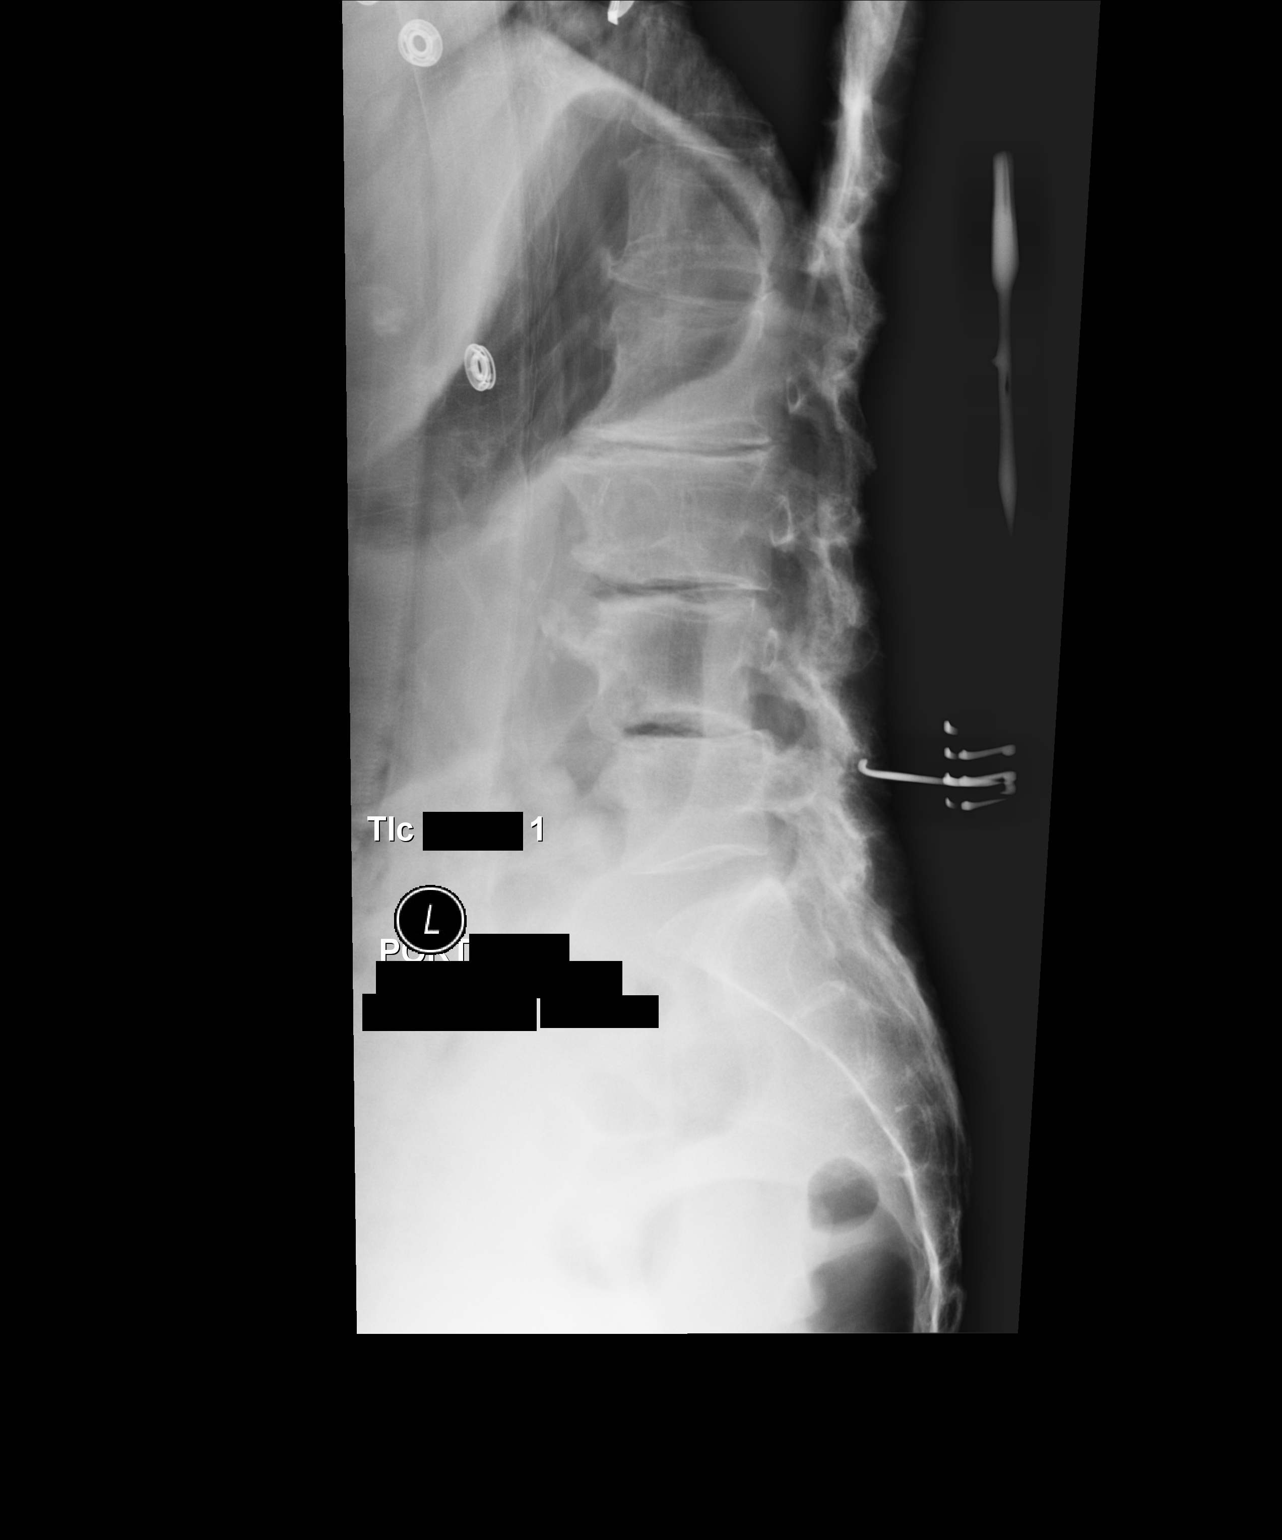

[1 of 1 positions shown; findings below may reference images not displayed]

FINDINGS: Single image shows a probe directed at the pedicle level of L5.
IMPRESSION: Pedicle level L5 localized.

## 2022-05-25 ENCOUNTER — Encounter: Payer: Self-pay | Admitting: Orthopaedic Surgery

## 2022-05-26 ENCOUNTER — Encounter: Payer: Self-pay | Admitting: Orthopaedic Surgery

## 2022-05-26 ENCOUNTER — Ambulatory Visit (INDEPENDENT_AMBULATORY_CARE_PROVIDER_SITE_OTHER): Payer: Medicare Other | Admitting: Orthopaedic Surgery

## 2022-05-26 DIAGNOSIS — Z96641 Presence of right artificial hip joint: Secondary | ICD-10-CM

## 2022-05-26 NOTE — Progress Notes (Signed)
The patient is well-known to me.  She is an active 81 year old female.  She is 6 weeks status post a right hip replacement.  She is still ambulating with a walker because she has not been able to get to therapy much due to her husband who has significant medical issues and has been hospitalized.  She does report that she is planning to attend outpatient therapy starting September 20.  She has been on her feet quite a bit and that is made her more sore with the right hip as she has been taking care of her husband and getting to appointments and other things back and forth to the hospital.  Her right hip is moving smoothly and when she first gets up she is certainly slightly weak and sore but appears appropriate overall.  I would like to see her back in 4 weeks to make sure she is doing well after course of outpatient physical therapy.  We might as well obtain an AP pelvis at that visit standing.

## 2022-06-21 ENCOUNTER — Telehealth: Payer: Self-pay

## 2022-06-21 NOTE — Telephone Encounter (Signed)
[facility] dentist office called wanting to know if pre-medication is required before dental treatment.  Please fax to [phone].  CB# [phone].  Please advise.  Thank you.

## 2022-06-22 ENCOUNTER — Other Ambulatory Visit: Payer: Self-pay

## 2022-06-22 MED ORDER — AMOXICILLIN 500 MG PO TABS: ORAL_TABLET | ORAL | 0 refills | Status: DC

## 2022-06-22 NOTE — Telephone Encounter (Signed)
Note faxed to dentist Left VM for patient letting her know I sent antibiotics in for her

## 2022-06-27 ENCOUNTER — Encounter: Payer: Medicare Other | Admitting: Physician Assistant

## 2022-06-28 ENCOUNTER — Ambulatory Visit (INDEPENDENT_AMBULATORY_CARE_PROVIDER_SITE_OTHER): Payer: Medicare Other | Admitting: Orthopaedic Surgery

## 2022-06-28 ENCOUNTER — Encounter: Payer: Self-pay | Admitting: Orthopaedic Surgery

## 2022-06-28 ENCOUNTER — Ambulatory Visit (INDEPENDENT_AMBULATORY_CARE_PROVIDER_SITE_OTHER): Payer: Medicare Other

## 2022-06-28 DIAGNOSIS — Z96641 Presence of right artificial hip joint: Secondary | ICD-10-CM | POA: Diagnosis not present

## 2022-06-28 NOTE — Progress Notes (Addendum)
HPI: Mrs. Motyka comes in today status post right total hip arthroplasty 04/15/2022.  She states she is overall improving.  She is going to physical therapy and feels it is beneficial.  Still has some pain in the hip and ranks it to be 3 out of 10 pain at worst.  She takes occasional Tylenol.  She is using a cane to ambulate.  She has pain lateral aspect of the hip.  Review of systems: See HPI otherwise negative  Physical exam: General: Thin well-developed female in no acute distress. Right hip: Good range of motion without pain.  Minimal tenderness to trochanteric region.  Calf supple nontender dorsiflexion plantarflexion right ankle intact.  Radiographs: AP pelvis: Bilateral hips well located.  Status post right total hip arthroplasty with well-seated components.  No acute fractures or acute findings.  Impression: Status post right total hip arthroplasty 04/15/2022  Plan: We will have her follow-up with [place] in 6 months we will obtain an AP pelvis and lateral view of the right hip at that time.  She will follow-up sooner if there is any questions concerns.  Continue work with therapy discussed IT band stretching exercises with her today.  Questions were encouraged and answered by Dr. Ninfa Linden and myself.

## 2022-11-24 ENCOUNTER — Encounter: Payer: Self-pay | Admitting: Radiology

## 2022-12-28 ENCOUNTER — Ambulatory Visit: Payer: Medicare Other | Admitting: Orthopaedic Surgery

## 2022-12-28 ENCOUNTER — Other Ambulatory Visit (INDEPENDENT_AMBULATORY_CARE_PROVIDER_SITE_OTHER): Payer: Medicare Other

## 2022-12-28 ENCOUNTER — Encounter: Payer: Self-pay | Admitting: Orthopaedic Surgery

## 2022-12-28 DIAGNOSIS — Z96641 Presence of right artificial hip joint: Secondary | ICD-10-CM

## 2022-12-28 NOTE — Progress Notes (Signed)
H PI: Ms. Flash returns today status post right total hip arthroplasty 04/15/2022.  She states overall she is doing well has some mild discomfort if she walks a lot on concrete.  Has some concerns about her hip.  She has had a couple of occasions where she has been helping her husband to sit down and at least on 1 occasion she fell due to him becoming off balance.  She fell on the right hip this was several months ago.  She otherwise is doing well.  She is using no assistive device to ambulate.  He states that her incisions healing well from that the distal incision is almost completely faded.  Review of systems: See HPI otherwise negative or noncontributory  Physical exam: General well-developed well-nourished female no acute distress mood affect appropriate.  Ambulates without any assistive device. Right hip full range of motion without pain.  Calf supple nontender.  Dorsiflexion plantarflexion right ankle intact.   Radiographs: AP pelvis lateral view right hip: Status post right total hip arthroplasty well-seated components.  No acute fractures or acute findings.  Bilateral hips well located.  GEN changes lumbar spine noted.  Impression: Status post right total hip arthroplasty  Plan: She is activities as tolerated.  Follow-up with [place] on an as-needed basis.  Questions were encouraged and answered at length by Dr. Magnus Ivan and myself.  She will continue work on scar tissue mobilization.

## 2024-07-26 ENCOUNTER — Other Ambulatory Visit (HOSPITAL_BASED_OUTPATIENT_CLINIC_OR_DEPARTMENT_OTHER): Payer: Self-pay

## 2024-07-26 MED ORDER — COMIRNATY 30 MCG/0.3ML IM SUSY
0.3000 mL | PREFILLED_SYRINGE | Freq: Once | INTRAMUSCULAR | 0 refills | Status: AC
  Filled 2024-07-26: qty 0.3, 1d supply, fill #0
